# Patient Record
Sex: Female | Born: 1950 | Race: White | Hispanic: No | Marital: Married | Smoking: Never smoker
Health system: Southern US, Community
[De-identification: ages and names within clinical notes are randomized; demographics above are authoritative.]

## PROBLEM LIST (undated history)

## (undated) DIAGNOSIS — C4370 Malignant melanoma of unspecified lower limb, including hip: Secondary | ICD-10-CM

## (undated) DIAGNOSIS — C4490 Unspecified malignant neoplasm of skin, unspecified: Secondary | ICD-10-CM

## (undated) DIAGNOSIS — K5792 Diverticulitis of intestine, part unspecified, without perforation or abscess without bleeding: Secondary | ICD-10-CM

## (undated) DIAGNOSIS — I8390 Asymptomatic varicose veins of unspecified lower extremity: Secondary | ICD-10-CM

## (undated) DIAGNOSIS — K5730 Diverticulosis of large intestine without perforation or abscess without bleeding: Secondary | ICD-10-CM

## (undated) DIAGNOSIS — F1910 Other psychoactive substance abuse, uncomplicated: Secondary | ICD-10-CM

## (undated) DIAGNOSIS — I10 Essential (primary) hypertension: Secondary | ICD-10-CM

## (undated) HISTORY — PX: FOOT SURGERY: SHX648

## (undated) HISTORY — DX: Asymptomatic varicose veins of unspecified lower extremity: I83.90

## (undated) HISTORY — PX: COLONOSCOPY: SHX174

## (undated) HISTORY — DX: Unspecified malignant neoplasm of skin, unspecified: C44.90

## (undated) HISTORY — PX: FLEXIBLE SIGMOIDOSCOPY: SHX1649

## (undated) HISTORY — DX: Other psychoactive substance abuse, uncomplicated: F19.10

## (undated) HISTORY — PX: CATARACT EXTRACTION, BILATERAL: SHX1313

## (undated) HISTORY — DX: Diverticulitis of intestine, part unspecified, without perforation or abscess without bleeding: K57.92

## (undated) HISTORY — DX: Diverticulosis of large intestine without perforation or abscess without bleeding: K57.30

## (undated) HISTORY — DX: Malignant melanoma of unspecified lower limb, including hip: C43.70

---

## 2004-03-14 ENCOUNTER — Ambulatory Visit: Payer: Self-pay | Admitting: Internal Medicine

## 2004-10-20 ENCOUNTER — Emergency Department (HOSPITAL_COMMUNITY): Admission: EM | Admit: 2004-10-20 | Discharge: 2004-10-21 | Payer: Self-pay | Admitting: Emergency Medicine

## 2004-10-23 ENCOUNTER — Ambulatory Visit: Payer: Self-pay | Admitting: Internal Medicine

## 2005-07-05 ENCOUNTER — Emergency Department (HOSPITAL_COMMUNITY): Admission: EM | Admit: 2005-07-05 | Discharge: 2005-07-05 | Payer: Self-pay | Admitting: Family Medicine

## 2006-01-20 ENCOUNTER — Ambulatory Visit: Payer: Self-pay | Admitting: Internal Medicine

## 2006-06-23 ENCOUNTER — Emergency Department (HOSPITAL_COMMUNITY): Admission: EM | Admit: 2006-06-23 | Discharge: 2006-06-23 | Payer: Self-pay | Admitting: Family Medicine

## 2007-08-25 ENCOUNTER — Encounter: Payer: Self-pay | Admitting: *Deleted

## 2007-08-25 DIAGNOSIS — Z9189 Other specified personal risk factors, not elsewhere classified: Secondary | ICD-10-CM | POA: Insufficient documentation

## 2007-08-25 DIAGNOSIS — I10 Essential (primary) hypertension: Secondary | ICD-10-CM | POA: Insufficient documentation

## 2007-08-25 DIAGNOSIS — Z85820 Personal history of malignant melanoma of skin: Secondary | ICD-10-CM

## 2007-11-02 ENCOUNTER — Encounter: Payer: Self-pay | Admitting: Internal Medicine

## 2007-11-02 ENCOUNTER — Other Ambulatory Visit: Admission: RE | Admit: 2007-11-02 | Discharge: 2007-11-02 | Payer: Self-pay | Admitting: Obstetrics and Gynecology

## 2007-11-10 ENCOUNTER — Ambulatory Visit: Payer: Self-pay | Admitting: Gastroenterology

## 2007-11-24 ENCOUNTER — Ambulatory Visit: Payer: Self-pay | Admitting: Gastroenterology

## 2008-01-17 ENCOUNTER — Telehealth: Payer: Self-pay | Admitting: Internal Medicine

## 2008-02-17 ENCOUNTER — Ambulatory Visit: Payer: Self-pay | Admitting: Internal Medicine

## 2008-02-17 DIAGNOSIS — E559 Vitamin D deficiency, unspecified: Secondary | ICD-10-CM | POA: Insufficient documentation

## 2008-02-17 LAB — CONVERTED CEMR LAB
BUN: 20 mg/dL (ref 6–23)
Basophils Absolute: 0 10*3/uL (ref 0.0–0.1)
Basophils Relative: 1.1 % (ref 0.0–3.0)
Calcium: 9.6 mg/dL (ref 8.4–10.5)
Chloride: 104 meq/L (ref 96–112)
Cholesterol: 193 mg/dL (ref 0–200)
Creatinine, Ser: 0.8 mg/dL (ref 0.4–1.2)
Eosinophils Absolute: 0.1 10*3/uL (ref 0.0–0.7)
GFR calc Af Amer: 95 mL/min
GFR calc non Af Amer: 79 mL/min
HCT: 39 % (ref 36.0–46.0)
HDL: 41.7 mg/dL (ref >39.0)
MCHC: 33.5 g/dL (ref 30.0–36.0)
MCV: 90.3 fL (ref 78.0–100.0)
Monocytes Absolute: 0.4 10*3/uL (ref 0.1–1.0)
Neutro Abs: 2.1 10*3/uL (ref 1.4–7.7)
Neutrophils Relative %: 52.1 % (ref 43.0–77.0)
RBC: 4.32 M/uL (ref 3.87–5.11)
TSH: 1.8 microintl units/mL (ref 0.35–5.50)
VLDL: 14 mg/dL (ref 0–40)

## 2008-02-20 ENCOUNTER — Encounter: Payer: Self-pay | Admitting: Internal Medicine

## 2008-05-15 ENCOUNTER — Encounter: Payer: Self-pay | Admitting: Internal Medicine

## 2008-05-22 ENCOUNTER — Telehealth: Payer: Self-pay | Admitting: Internal Medicine

## 2009-02-14 ENCOUNTER — Telehealth (INDEPENDENT_AMBULATORY_CARE_PROVIDER_SITE_OTHER): Payer: Self-pay | Admitting: *Deleted

## 2009-07-18 ENCOUNTER — Ambulatory Visit: Payer: Self-pay | Admitting: Internal Medicine

## 2009-07-18 DIAGNOSIS — N3090 Cystitis, unspecified without hematuria: Secondary | ICD-10-CM

## 2009-07-18 LAB — CONVERTED CEMR LAB
Bilirubin Urine: NEGATIVE
Ketones, urine, test strip: NEGATIVE
Protein, U semiquant: NEGATIVE
Specific Gravity, Urine: <1.005
Urobilinogen, UA: 0.2
pH: 5

## 2010-01-14 ENCOUNTER — Telehealth: Payer: Self-pay | Admitting: Gastroenterology

## 2010-01-16 ENCOUNTER — Telehealth: Payer: Self-pay | Admitting: Gastroenterology

## 2010-01-16 ENCOUNTER — Ambulatory Visit: Payer: Self-pay | Admitting: Gastroenterology

## 2010-01-16 DIAGNOSIS — R1032 Left lower quadrant pain: Secondary | ICD-10-CM | POA: Insufficient documentation

## 2010-01-25 ENCOUNTER — Ambulatory Visit: Payer: Self-pay | Admitting: Gastroenterology

## 2010-01-25 ENCOUNTER — Ambulatory Visit (HOSPITAL_COMMUNITY): Admission: RE | Admit: 2010-01-25 | Discharge: 2010-01-25 | Payer: Self-pay | Admitting: Gastroenterology

## 2010-02-06 ENCOUNTER — Telehealth: Payer: Self-pay | Admitting: Internal Medicine

## 2010-06-04 NOTE — Assessment & Plan Note (Signed)
Summary: PINK URINE FREQUENCY  STC   Vital Signs:  Patient profile:   60 year old female Height:      63 inches Weight:      188 pounds BMI:     33.42 O2 Sat:      97 % on Room air Temp:     97.4 degrees F oral Pulse rate:   78 / minute BP sitting:   152 / 100  (left arm) Cuff size:   regular  Vitals Entered By: Bill Salinas CMA (July 18, 2009 9:46 AM)  O2 Flow:  Room air CC: pt here with complaint of urinary freq. that just started this am, pt states urine had a pinkish color but denies any pain with urination/pt is due for mammogram/ ab   Primary Care Provider:  Jacques Navy MD  CC:  pt here with complaint of urinary freq. that just started this am and pt states urine had a pinkish color but denies any pain with urination/pt is due for mammogram/ ab.  History of Present Illness: She has frequency, uregency and hematuria. Denies fever, back pain, N/V. She does have minor dysuria.  Current Medications (verified): 1)  Benazepril Hcl 20 Mg Tabs (Benazepril Hcl) .... 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  The patient denies anorexia, fever, weight loss, chest pain, abdominal pain, muscle weakness, and enlarged lymph nodes.    Physical Exam  General:  Well-developed,well-nourished,in no acute distress; alert,appropriate and cooperative throughout examination Head:   normocephalic and atraumatic.   Eyes:  pupils equal, pupils round, corneas and lenses clear, and no injection.   Lungs:  normal respiratory effort and normal breath sounds.   Heart:  normal rate and regular rhythm.     Impression & Recommendations:  Problem # 1:  UTI (ICD-599.0) Positive symptoms and dip U/A  Plan - Septra DS two times a day x 5           azo-pyridine as needed           fluids - cranberry  Her updated medication list for this problem includes:    Sulfamethoxazole-tmp Ds 800-160 Mg Tabs (Sulfamethoxazole-trimethoprim) .Marland Kitchen... 1 by mouth two times a day  Complete Medication List: 1)  Benazepril Hcl 20 Mg Tabs (Benazepril hcl) .... Take 1 tablet by mouth once a day 2)  Sulfamethoxazole-tmp Ds 800-160 Mg Tabs (Sulfamethoxazole-trimethoprim) .Marland Kitchen.. 1 by mouth two times a day Prescriptions: SULFAMETHOXAZOLE-TMP DS 800-160 MG TABS (SULFAMETHOXAZOLE-TRIMETHOPRIM) 1 by mouth two times a day  #10 x 0   Entered and Authorized by:   Jacques Navy MD   Signed by:   Jacques Navy MD on 07/18/2009   Method used:   Electronically to        [facility] [address] [phone]* (retail)       [address]  [place], [place]  [address]       Ph: [phone] or [phone]       Fax: [phone]   RxID:   [id number]     Laboratory Results   Urine Tests   Date/Time Reported: 07/18/2009 9:55am  Routine Urinalysis   Color: lt. yellow Appearance: Clear Glucose: negative   (Normal Range: Negative) Bilirubin: negative   (Normal Range: Negative) Ketone: negative   (Normal Range: Negative) Spec. Gravity: <1.005   (Normal Range: 1.003-1.035) Blood: large   (Normal Range: Negative) pH: 5.0   (Normal Range: 5.0-8.0) Protein: negative   (Normal Range: Negative) Urobilinogen: 0.2   (Normal Range: 0-1) Nitrite: negative   (Normal Range: Negative) Leukocyte Esterace: large   (Normal Range: Negative)

## 2010-06-04 NOTE — Procedures (Signed)
Summary: Flexible Sigmoidoscopy  Patient: Mallory Hines Note: All result statuses are Final unless otherwise noted.  Tests: (1) Flexible Sigmoidoscopy (FLX)  FLX Flexible Sigmoidoscopy                             DONE     [facility]     [address] [place], [place]  [address]           FLEXIBLE SIGMOIDOSCOPY PROCEDURE REPORT           PATIENT:  Mallory Hines  MR#:  [id number]     BIRTHDATE:  1950/07/18, 59 yrs. old  GENDER:  female           ENDOSCOPIST:  Barbette Hair. Arlyce Dice, MD     Referred by:           PROCEDURE DATE:  01/25/2010     PROCEDURE:  Flexible Sigmoidoscopy, diagnostic     ASA CLASS:  Class I     INDICATIONS:  rectal bleeding           MEDICATIONS:   Fentanyl 65 mcg IV, Versed 7 mg IV           DESCRIPTION OF PROCEDURE:   After the risks benefits and     alternatives of the procedure were thoroughly explained, informed     consent was obtained.  Digital rectal exam was performed and     revealed moderate external hemorrhoids.   The Pentax Colonoscope     [id number] endoscope was introduced through the anus and advanced to     the descending colon (60cm), without limitations.  The quality of     the prep was .  The instrument was then slowly withdrawn as the     mucosa was fully examined.     <<PROCEDUREIMAGES>>           Scattered diverticula were found in the sigmoid colon (see     image1).  The examination was otherwise normal (see image2).     Retroflexed views in the rectum revealed no abnormalities.    The     scope was then withdrawn from the patient and the procedure     terminated.           COMPLICATIONS:  None           ENDOSCOPIC IMPRESSION:     1) Diverticula, scattered in the sigmoid colon     2) Otherwise normal examination.           Limited rectal bleeding secondary to hemorrhoids           RECOMMENDATIONS:     1) follow-up: office PRN           REPEAT EXAM:  No           ______________________________     Barbette Hair. Arlyce Dice,  MD           CC:  Jacques Navy, MD           n.     Rosalie DoctorBarbette Hair. Kaplan at 01/25/2010 09:05 AM           Valinda Party, [phone]  Note: An exclamation mark (!) indicates a result that was not dispersed into the flowsheet. Document Creation Date: 01/25/2010 9:06 AM _______________________________________________________________________  (1) Order result status: Final Collection or observation date-time: 01/25/2010 09:02 Requested date-time:  Receipt date-time:  Reported  date-time:  Referring Physician:   Ordering Physician: Melvia Heaps [phone]) Specimen Source:  Source: Launa Grill Order Number: [id number] Lab site:

## 2010-06-04 NOTE — Letter (Signed)
Summary: [facility]  [address] [place], [place] [address]   Phone: [phone]  Fax: [phone]       SEDONIA KITNER    16-Jun-1950    MRN: [id number]        Procedure Day /Date:FRIDAY 01/25/2010     Arrival Time:7:30AM     Procedure Time:8:30AM     Location of Procedure:                     X   [facility] ( Outpatient Registration)     PREPARATION FOR FLEXIBLE SIGMOIDOSCOPY WITH MAGNESIUM CITRATE  Prior to the day before your procedure, purchase one 8 oz. bottle of Magnesium Citrate and one Fleet Enema from the laxative section of your drugstore.  _________________________________________________________________________________________________  THE DAY BEFORE YOUR PROCEDURE             DATE:01/24/2010  DAY: THURSDAY  1.   Have a clear liquid dinner the night before your procedure.  2.   Do not drink anything colored red or purple.  Avoid juices with pulp.  No orange juice.              CLEAR LIQUIDS INCLUDE: Water Jello Ice Popsicles Tea (sugar ok, no milk/cream) Powdered fruit flavored drinks Coffee (sugar ok, no milk/cream) Gatorade Juice: apple, white grape, white cranberry  Lemonade Clear bullion, consomm, broth Carbonated beverages (any kind) Strained chicken noodle soup Hard Candy   3.   At 7:00 pm the night before your procedure, drink one bottle of Magnesium Citrate over ice.  4.   Drink at least 3 more glasses of clear liquids before bedtime (preferably juices).  5.   Results are expected usually within 1 to 6 hours after taking the Magnesium Citrate.  ___________________________________________________________________________________________________  THE DAY OF YOUR PROCEDURE            DATE: 01/25/2010   DAY: FRIDAY 1.   Use Fleet Enema one hour prior to coming for procedure 2.   You may drink clear liquids until 4:30AM  (4  hours before exam)      MEDICATION INSTRUCTIONS  Unless otherwise  instructed, you should take regular prescription medications with a small sip of water as early as possible the morning of your procedure.         OTHER INSTRUCTIONS  You will need a responsible adult at least 59 years of age to accompany you and drive you home.   This person must remain in the waiting room during your procedure.  Wear loose fitting clothing that is easily removed.  Leave jewelry and other valuables at home.  However, you may wish to bring a book to read or an iPod/MP3 player to listen to music as you wait for your procedure to start.  Remove all body piercing jewelry and leave at home.  Total time from sign-in until discharge is approximately 2-3 hours.  You should go home directly after your procedure and rest.  You can resume normal activities the day after your procedure.  The day of your procedure you should not:   Drive   Make legal decisions   Operate machinery   Drink alcohol   Return to work  You will receive specific instructions about eating, activities and medications before you leave.   The above instructions have been reviewed and explained to me by   _______________________    I fully understand and can verbalize these instructions _____________________________ Date _________

## 2010-06-04 NOTE — Progress Notes (Signed)
Summary: Triage-Rectal Bleeding  ---- Converted from flag ---- ---- 01/14/2010 10:41 AM, Harlow Mares CMA (AAMA) wrote: per paging system patient called at 6:45am states that she is passing blood. ------------------------------  Phone Note Outgoing Call Call back at [facility] Phone [phone]   Call placed by: Laureen Ochs LPN,  January 14, 2010 11:13 AM Call placed to: Patient Summary of Call: Message left for patient to callback.  Initial call taken by: Laureen Ochs LPN,  January 14, 2010 11:15 AM  Follow-up for Phone Call        Message left for patient to callback. Laureen Ochs LPN  January 14, 2010 12:34 PM  Message left for patient to callback. Laureen Ochs LPN  January 14, 2010 2:29 PM   Additional Follow-up for Phone Call Additional follow up Details #1::        Pt. states she had abd. pain and some rectal bleeding  this weekend. She spoke with Dr.Brodie on call and was started on Cipro/Flagyl.  Pt. is feeling better now, but needs to be seen for follow-up.  1) See Dr.Kaplan 01-16-10 at 11:15am 2) Take antibiotics as directed 3) Soft,bland diet. No spicy,greasy,fried foods. 4) tylenol/Ibuprofen as needed 5) Heating pad to abdomen as needed. 6) If symptoms become worse call back immediately or go to ER.  Additional Follow-up by: Laureen Ochs LPN,  January 14, 2010 2:45 PM

## 2010-06-04 NOTE — Procedures (Signed)
Summary: Instructions For Procedure / [facility]  Instructions For Procedure / [facility]   Imported By: Lennie Odor 01/18/2010 14:29:16  _____________________________________________________________________  External Attachment:    Type:   Image     Comment:   External Document

## 2010-06-04 NOTE — Progress Notes (Signed)
Summary: flu vaccine    Immunization History:  Influenza Immunization History:    Influenza:  .05ml left deltoid im novartis lot# [id number] (01/31/2010)

## 2010-06-04 NOTE — Progress Notes (Signed)
Summary: Triage-diarrhea  Phone Note Call from Patient Call back at Home Phone [phone]   Caller: Patient Call For: Dr. Arlyce Dice Reason for Call: Talk to Nurse Summary of Call: having diarrhea  Initial call taken by: Vallarie Mare,  January 16, 2010 2:25 PM  Follow-up for Phone Call        Message left for patient to callback. Laureen Ochs LPN  January 16, 2010 2:55 PM  Message left for patient to callback. Laureen Ochs LPN  January 17, 2010 8:10 AM   Additional Follow-up for Phone Call Additional follow up Details #1::        Pt. states she is having watery stools after she eats. Denies pain, blood, fever.   1) Begin a Probiotic daily for 10 days 2) Immodium as needed for diarrhea. 3) BRAT diet, advance as tolerated 4) Keep Sigmoidoscopy appt. 5) If symptoms become worse call back immediately. Additional Follow-up by: Laureen Ochs LPN,  January 17, 2010 11:10 AM    Additional Follow-up for Phone Call Additional follow up Details #2::    ok Follow-up by: Louis Meckel MD,  January 17, 2010 2:40 PM

## 2010-06-04 NOTE — Assessment & Plan Note (Signed)
Summary: F/U ON DIVERTICULITIS FLARE                  Mallory Hines   History of Present Illness Visit Type: Initial Visit Primary GI MD: Melvia Heaps MD [facility] Primary Aiko Belko: Jacques Navy MD Chief Complaint: Patient developed some abdominal pain and bloating for the last week. She has been eating alot of nuts and she passed alot of blood Sunday morning. She called Dr. Juanda Chance on call and she gave cipro and flagyl. History of Present Illness:   Mallory Hines is a pleasant 60 year old white female referred at the request of Dr. Debby Bud for evaluation of abdominal pain.  Approximally 4 days ago she developed moderately severe left lower quadrant pain that tended to occur with walking.  When lying still she was pain-free.  This was accompanied by moderate constipation.  When she did have a bowel movement she passed a small amount of dark blood.  She has diverticulosis determined by colonoscopy in 2009.  Within hours of starting Cipro and Flagyl her abdominal pain entirely resolved.  The following day she had a normal bowel movement.  She has had no recurrent bleeding.   GI Review of Systems    Reports abdominal pain and  bloating.      Denies acid reflux, belching, chest pain, dysphagia with liquids, dysphagia with solids, heartburn, loss of appetite, nausea, vomiting, vomiting blood, weight loss, and  weight gain.      Reports change in bowel habits, constipation, diarrhea, and  rectal bleeding.     Denies anal fissure, black tarry stools, diverticulosis, fecal incontinence, heme positive stool, hemorrhoids, irritable bowel syndrome, jaundice, light color stool, liver problems, and  rectal pain.    Current Medications (verified): 1)  Lotensin 20 Mg Tabs (Benazepril Hcl) .... Take One By Mouth Once Daily 2)  Metronidazole 250 Mg Tabs (Metronidazole) .... Take One By Mouth Three Times A Day 3)  Ciprofloxacin Hcl 500 Mg Tabs (Ciprofloxacin Hcl) .... Take One By Mouth Two Times A Day  Allergies  (verified): 1)  Codeine 2)  Nsaids 3)  Celebrex 4)  * Mobic  Past History:  Past Medical History: Reviewed history from 01/15/2010 and no changes required. MELANOMA, LEG, HX OF (ICD-V10.82) CHICKENPOX, HX OF AS A CHILD (ICD-V15.9) MEASLES, HX OF AS A CHILD (ICD-V12.09) MORTON'S NEUROMA, HX OF (ICD-V15.9) HYPERTENSION (ICD-401.9) Diverticulosis 2009  Past Surgical History: Reviewed history from 02/17/2008 and no changes required. WISDOM TEETH EXTRACTION, HX OF (ICD-V15.9) CAESAREAN SECTION, HX OF (ICD-V39.01) * Hx of SURGICAL EXCISION OF A MELANOMA FROM RIGHT LOWER EXTREMITY  G2P2 1 by C-section     Family History: Reviewed history from 02/17/2008 and no changes required. Mother - deceased @ 60 - pancreatic cancer, OA, Father - deceased @ 30: CAD/MI -fatal Neg- colon cancer Matuernal Aunt - breast cancer Sister - IDDM  Social History: Reviewed history from 02/17/2008 and no changes required. 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  Review of Systems  The patient denies allergy/sinus, anemia, anxiety-new, arthritis/joint pain, back pain, blood in urine, breast changes/lumps, change in vision, confusion, cough, coughing up blood, depression-new, fainting, fatigue, fever, headaches-new, hearing problems, heart murmur, heart rhythm changes, itching, menstrual pain, muscle pains/cramps, night sweats, nosebleeds, pregnancy symptoms, shortness of breath, skin rash, sleeping problems, sore throat, swelling of feet/legs, swollen lymph glands, thirst - excessive , urination - excessive , urination changes/pain,  urine leakage, vision changes, and voice change.    Vital Signs:  Patient profile:   60 year old female Height:      63 inches Weight:      183.6 pounds BMI:     32.64 Pulse rate:   80 / minute Pulse rhythm:   regular BP sitting:   142 / 94  (left arm) Cuff size:    regular  Vitals Entered By: Harlow Mares CMA Duncan Dull) (January 16, 2010 11:09 AM)  Physical Exam  Additional Exam:  On physical exam she is a well-developed well-nourished female  skin: anicteric HEENT: normocephalic; PEERLA; no nasal or pharyngeal abnormalities neck: supple nodes: no cervical lymphadenopathy chest: clear to ausculatation and percussion heart: no murmurs, gallops, or rubs abd: soft, nontender; BS normoactive; no abdominal masses, tenderness, organomegaly rectal: deferred ext: no cynanosis, clubbing, edema skeletal: no deformities neuro: oriented x 3; no focal abnormalities    Impression & Recommendations:  Problem # 1:  ABDOMINAL PAIN, LEFT LOWER QUADRANT (ICD-789.04)  Symptoms could have been due to mild diverticulitis.  Limited rectal bleeding may be seen with this;  alternatively;  she may have had  constipation with limited hemorrhoidal bleeding.  Recommendations #1 complete a seven-day course of antibiotics #2 flexible sigmoidoscopy in approximately one week  Orders: ZFLEX (ZFL)  Patient Instructions: 1)  Copy sent to : Jacques Navy MD 2)  Your Flex Sig is scheduled for 01/25/2010 at 8:30am at [facility] 3)  The medication list was reviewed and reconciled.  All changed / newly prescribed medications were explained.  A complete medication list was provided to the patient / caregiver.

## 2010-06-11 ENCOUNTER — Encounter: Payer: Self-pay | Admitting: Internal Medicine

## 2010-06-26 NOTE — Letter (Signed)
Summary: [facility]   Imported By: Sherian Rein 06/18/2010 12:48:29  _____________________________________________________________________  External Attachment:    Type:   Image     Comment:   External Document

## 2010-10-08 ENCOUNTER — Other Ambulatory Visit: Payer: Self-pay | Admitting: Internal Medicine

## 2011-04-25 ENCOUNTER — Telehealth: Payer: Self-pay | Admitting: Gastroenterology

## 2011-04-25 DIAGNOSIS — K5792 Diverticulitis of intestine, part unspecified, without perforation or abscess without bleeding: Secondary | ICD-10-CM

## 2011-04-25 MED ORDER — CIPROFLOXACIN HCL 500 MG PO TABS: 500.0000 mg | ORAL_TABLET | Freq: Two times a day (BID) | ORAL | Status: AC

## 2011-04-25 MED ORDER — METRONIDAZOLE 500 MG PO TABS: 500.0000 mg | ORAL_TABLET | Freq: Two times a day (BID) | ORAL | Status: AC

## 2011-04-25 NOTE — Telephone Encounter (Signed)
Pt scheduled for CT scan 05/02/11 pt to arrive at [facility] CT @8 :45am. Pt to drink 1 bottle of contrast at 7am, the 2nd bottle of contrast at 8am. Pt to be NPO 4 hours prior to scan. Pt to pick up contrast 05/01/11.

## 2011-04-25 NOTE — Telephone Encounter (Signed)
Addended by: Chrystie Nose R on: 04/25/2011 02:05 PM   Modules accepted: Orders

## 2011-04-25 NOTE — Telephone Encounter (Signed)
Mallory Hines pt calling stating that she thinks she is having a diverticulitis flare. Pt states she is having pain in her lower left quadrant below her belly button. Pt states it hurts when she walks and when she sits, she is very tender and she is bloated. Last flare was in Sept of 2011, at that time she was given Flagyl and Cipro. Dr. Jarold Motto as doc of the day please advise.

## 2011-04-25 NOTE — Telephone Encounter (Signed)
This lady needs office followup with PA next week. However come in for CBC, sed rate, and she needs CT scan of the abdomen and pelvis. Go ahead and start Cipro 500 mg and metronidazole 500 mg twice a day for 7 days with office visit next week after her CT and labs. Previous sigmoidoscopy by Dr. Arlyce Dice showed minimal diverticulosis

## 2011-04-25 NOTE — Telephone Encounter (Signed)
Pt aware Rx's sent to the pharmacy

## 2011-04-28 ENCOUNTER — Other Ambulatory Visit: Payer: Self-pay

## 2011-05-01 ENCOUNTER — Encounter: Payer: Self-pay | Admitting: Gastroenterology

## 2011-05-01 ENCOUNTER — Telehealth: Payer: Self-pay | Admitting: Gastroenterology

## 2011-05-01 NOTE — Telephone Encounter (Signed)
Error

## 2011-05-01 NOTE — Telephone Encounter (Signed)
Pt walked into ofc before I could call her back. Pt wanted the contrast for her CT in am and her appt time. Went over instructions for CT in am at [facility] location; arrive at 08:45am; drink 1 bottle contrast at 7am, the other at 0800am,; NPO 4 hours prior. There are no appt with mid levels in for Monday, but I explained to pt Dr Arlyce Dice is out on leave and when appts are available with a mid level, I will call her. Pt stated understanding.

## 2011-05-02 ENCOUNTER — Ambulatory Visit (INDEPENDENT_AMBULATORY_CARE_PROVIDER_SITE_OTHER)
Admission: RE | Admit: 2011-05-02 | Discharge: 2011-05-02 | Disposition: A | Discharge status: Home / Self Care | Payer: PRIVATE HEALTH INSURANCE | Source: Ambulatory Visit | Attending: Gastroenterology | Admitting: Gastroenterology

## 2011-05-02 DIAGNOSIS — K5732 Diverticulitis of large intestine without perforation or abscess without bleeding: Secondary | ICD-10-CM

## 2011-05-02 DIAGNOSIS — K5792 Diverticulitis of intestine, part unspecified, without perforation or abscess without bleeding: Secondary | ICD-10-CM

## 2011-05-02 MED ORDER — IOHEXOL 300 MG/ML  SOLN
80.0000 mL | Freq: Once | INTRAMUSCULAR | Status: AC | PRN
  Administered 2011-05-02: 80 mL via INTRAVENOUS

## 2011-05-05 ENCOUNTER — Telehealth: Payer: Self-pay | Admitting: Gastroenterology

## 2011-05-05 NOTE — Telephone Encounter (Signed)
Discussed results of pt's CT scan with pt. She reports she is better and reports she fell down the stairs about 2 weeks ago. She did do a somersault and maybe this agravated her problems. I did explain she does have some changes in the lumbar region. Per her request, mailed her a copy of the scan. Pt will call back for further problems.

## 2011-05-21 ENCOUNTER — Other Ambulatory Visit: Payer: Self-pay | Admitting: Internal Medicine

## 2011-05-21 NOTE — Telephone Encounter (Signed)
Refill request for benazepril 20 mg with 6 refills. Last refilled on 6.5.12. Pt has not been seen within a year. OK to refill?

## 2011-05-28 ENCOUNTER — Ambulatory Visit: Payer: PRIVATE HEALTH INSURANCE

## 2011-05-29 ENCOUNTER — Ambulatory Visit (INDEPENDENT_AMBULATORY_CARE_PROVIDER_SITE_OTHER): Payer: PRIVATE HEALTH INSURANCE

## 2011-05-29 DIAGNOSIS — Z23 Encounter for immunization: Secondary | ICD-10-CM

## 2011-08-15 ENCOUNTER — Telehealth: Payer: Self-pay | Admitting: Gastroenterology

## 2011-08-15 NOTE — Telephone Encounter (Signed)
Pt states she has been doing a lot of lifting moving her lawn furniture. She has noticed some bright red blood from her rectum. Pt wants to know if this could have aggravated her hemorrhoids. Let pt know that is possible. Pt instructed to try some OTC hemorrhoid medication and to call us if no improvement. Pt verbalized understanding.

## 2012-06-07 ENCOUNTER — Other Ambulatory Visit: Payer: Self-pay | Admitting: Internal Medicine

## 2012-08-31 ENCOUNTER — Other Ambulatory Visit: Payer: Self-pay | Admitting: Internal Medicine

## 2012-09-09 ENCOUNTER — Other Ambulatory Visit: Payer: Self-pay | Admitting: Internal Medicine

## 2012-09-21 ENCOUNTER — Encounter: Payer: Self-pay | Admitting: Internal Medicine

## 2012-09-21 ENCOUNTER — Ambulatory Visit (INDEPENDENT_AMBULATORY_CARE_PROVIDER_SITE_OTHER): Payer: 59 | Admitting: Internal Medicine

## 2012-09-21 VITALS — BP 170/98 | HR 76 | Temp 97.9°F | Resp 16 | Wt 182.0 lb

## 2012-09-21 DIAGNOSIS — I10 Essential (primary) hypertension: Secondary | ICD-10-CM

## 2012-09-21 DIAGNOSIS — E559 Vitamin D deficiency, unspecified: Secondary | ICD-10-CM

## 2012-09-21 MED ORDER — BENAZEPRIL HCL 20 MG PO TABS: 20.0000 mg | ORAL_TABLET | Freq: Every day | ORAL | Status: DC

## 2012-09-21 MED ORDER — VITAMIN D 1000 UNITS PO TABS: 1000.0000 [IU] | ORAL_TABLET | Freq: Every day | ORAL | Status: DC

## 2012-09-21 NOTE — Assessment & Plan Note (Signed)
Re-start Vit D 

## 2012-09-21 NOTE — Assessment & Plan Note (Signed)
Mallory Hines has run out of her meds a few days ago Re-start Rx Well w/labs w/Dr Norins in 6 mo

## 2012-09-21 NOTE — Progress Notes (Signed)
  Subjective:    HPI  C/o elev BP - ran out of meds a few days ago  Wt Readings from Last 3 Encounters:  09/21/12 182 lb (82.555 kg)  01/16/10 183 lb 9.6 oz (83.28 kg)  07/18/09 188 lb (85.276 kg)   BP Readings from Last 3 Encounters:  09/21/12 170/98  01/16/10 142/94  07/18/09 152/100       Review of Systems  Constitutional: Negative.  Negative for fever, chills, diaphoresis, activity change, appetite change, fatigue and unexpected weight change.  HENT: Negative for hearing loss, ear pain, nosebleeds, congestion, sore throat, facial swelling, rhinorrhea, sneezing, mouth sores, trouble swallowing, neck pain, neck stiffness, postnasal drip, sinus pressure and tinnitus.   Eyes: Negative for pain, discharge, redness, itching and visual disturbance.  Respiratory: Negative for cough, chest tightness, shortness of breath, wheezing and stridor.   Cardiovascular: Negative for chest pain, palpitations and leg swelling.  Gastrointestinal: Negative for nausea, diarrhea, constipation, blood in stool, abdominal distention, anal bleeding and rectal pain.  Genitourinary: Negative for dysuria, urgency, frequency, hematuria, flank pain, vaginal bleeding, vaginal discharge, difficulty urinating, genital sores and pelvic pain.  Musculoskeletal: Negative for back pain, joint swelling, arthralgias and gait problem.  Skin: Negative.  Negative for rash.  Neurological: Negative for dizziness, tremors, seizures, syncope, speech difficulty, weakness, numbness and headaches.  Hematological: Negative for adenopathy. Does not bruise/bleed easily.  Psychiatric/Behavioral: Negative for suicidal ideas, behavioral problems, sleep disturbance, dysphoric mood and decreased concentration. The patient is not nervous/anxious.        Objective:   Physical Exam  Constitutional: She appears well-developed. No distress.  HENT:  Head: Normocephalic.  Right Ear: External ear normal.  Left Ear: External ear normal.   Nose: Nose normal.  Mouth/Throat: Oropharynx is clear and moist.  Eyes: Conjunctivae are normal. Pupils are equal, round, and reactive to light. Right eye exhibits no discharge. Left eye exhibits no discharge.  Neck: Normal range of motion. Neck supple. No JVD present. No tracheal deviation present. No thyromegaly present.  Cardiovascular: Normal rate, regular rhythm and normal heart sounds.   Pulmonary/Chest: No stridor. No respiratory distress. She has no wheezes.  Abdominal: Soft. Bowel sounds are normal. She exhibits no distension and no mass. There is no tenderness. There is no rebound and no guarding.  Musculoskeletal: She exhibits no edema and no tenderness.  Lymphadenopathy:    She has no cervical adenopathy.  Neurological: She displays normal reflexes. No cranial nerve deficit. She exhibits normal muscle tone. Coordination normal.  Skin: No rash noted. No erythema.  Psychiatric: She has a normal mood and affect. Her behavior is normal. Judgment and thought content normal.    Lab Results  Component Value Date   WBC 3.9* 02/17/2008   HGB 13.1 02/17/2008   HCT 39.0 02/17/2008   PLT 268 02/17/2008   GLUCOSE 111* 02/17/2008   CHOL 193 02/17/2008   TRIG 69 02/17/2008   HDL 41.7 02/17/2008   LDLCALC 138* 02/17/2008   NA 140 02/17/2008   K 4.6 02/17/2008   CL 104 02/17/2008   CREATININE 0.8 02/17/2008   BUN 20 02/17/2008   CO2 31 02/17/2008   TSH 1.80 02/17/2008          Assessment & Plan:

## 2013-05-12 ENCOUNTER — Telehealth: Payer: Self-pay | Admitting: Internal Medicine

## 2013-05-12 NOTE — Telephone Encounter (Signed)
I called patient back. She was inquiring if she has had the pneumonia vaccine. I advised her of the immunizations she's had but pneumonia is not one of them. She states she will be flying out to [facility] for a few days and would like to know if she should get it before she leaves or what upon her return. Please advise. Thanks.

## 2013-05-12 NOTE — Telephone Encounter (Signed)
Pt called request phone call from the assistant concern about the pneumonia shot that she got?. Please call pt

## 2013-05-12 NOTE — Telephone Encounter (Signed)
Ok to wait. Prevnar first, pneumovax 6 weeks later.

## 2013-05-13 NOTE — Telephone Encounter (Signed)
Left message for patient that she can wait to get the Prevnar vaccine when she returns.

## 2013-07-11 ENCOUNTER — Encounter: Payer: Self-pay | Admitting: *Deleted

## 2013-07-11 ENCOUNTER — Telehealth: Payer: Self-pay | Admitting: Gastroenterology

## 2013-07-11 NOTE — Telephone Encounter (Signed)
She really needs to be seen in the office with either an M.D. or an extender ASAP

## 2013-07-11 NOTE — Telephone Encounter (Signed)
Pt scheduled to see Alonza Bogus PA tomorrow at 3pm. Pt aware of appt date and time.

## 2013-07-11 NOTE — Telephone Encounter (Signed)
Pt ate some granola over the weekend and now she is having lots of bloating, gas, and thinks she is having a diverticulitis flare. Last episode pt had like this she was given Cipro and Flagyl, wants to know is she should be treated with this again. Please advise.

## 2013-07-12 ENCOUNTER — Encounter: Payer: Self-pay | Admitting: Gastroenterology

## 2013-07-12 ENCOUNTER — Ambulatory Visit (INDEPENDENT_AMBULATORY_CARE_PROVIDER_SITE_OTHER): Payer: 59 | Admitting: Gastroenterology

## 2013-07-12 VITALS — BP 130/78 | HR 92 | Ht 63.0 in | Wt 175.8 lb

## 2013-07-12 DIAGNOSIS — K5732 Diverticulitis of large intestine without perforation or abscess without bleeding: Secondary | ICD-10-CM | POA: Insufficient documentation

## 2013-07-12 DIAGNOSIS — R1032 Left lower quadrant pain: Secondary | ICD-10-CM | POA: Insufficient documentation

## 2013-07-12 MED ORDER — METRONIDAZOLE 500 MG PO TABS: 500.0000 mg | ORAL_TABLET | Freq: Two times a day (BID) | ORAL | Status: DC

## 2013-07-12 MED ORDER — CIPROFLOXACIN HCL 500 MG PO TABS: 500.0000 mg | ORAL_TABLET | Freq: Two times a day (BID) | ORAL | Status: DC

## 2013-07-12 NOTE — Patient Instructions (Signed)
We have sent the following medications to your pharmacy for you to pick up at your convenience: Cipro 500 mg, please take one tablet by mouth twice daily for ten days. Flagyl 500 mg, please take one tablet by mouth three times daily for ten days.

## 2013-07-12 NOTE — Progress Notes (Signed)
07/12/2013 Mallory Hines [id number] [birth year]   HISTORY OF PRESENT ILLNESS:  This is a pleasant 63 year old female who is known to Mallory Hines for previous colonoscopies. Her last colonoscopy was in July 2009 at which time she only had diverticulosis and repeat was recommended in 10 years from that time. She then had a flexible sigmoidoscopy in September 2011 at which time she was also only found to have diverticulosis, but the study was otherwise normal. She comes our office today stating that she ate a bunch of granola and nuts on Sunday and shortly after she developed left lower quadrant abdominal pain. She was also having a lot of gas, but that has resolved. She says that she was treated for diverticulitis in November 2014 with Cipro and Flagyl, which resolved her symptoms. Says that she was treated for this by our office, however, I do not see any documentation that we gave her these medications. Regardless, she says that the pain feels the same as it did that time. She denies any nausea, vomiting, fevers, chills, significant change in bowels, rectal bleeding.  She had a CT scan in 2012 for complaints of LLQ abdominal pain, but it was negative for diverticulitis at that time.   Past Medical History  Diagnosis Date  . Diverticulosis of colon (without mention of hemorrhage)   . Skin cancer [id number]    right calf, face   Past Surgical History  Procedure Laterality Date  . Cesarean section  1985  . Foot surgery Right     3 rd toe  . Colonoscopy    . Flexible sigmoidoscopy      reports that she has never smoked. She has never used smokeless tobacco. She reports that she drinks alcohol. She reports that she does not use illicit drugs. family history includes Diabetes in her sister; Heart attack (age of onset: 1) in her father; Pancreatic cancer in her mother. There is no history of Colon cancer. Allergies  Allergen Reactions  . Celecoxib     REACTION: whelps  . Codeine     REACTION: does  not like drug  . Meloxicam     REACTION: whelps  . Nsaids     REACTION: whelps      Outpatient Encounter Prescriptions as of 07/12/2013  Medication Sig  . benazepril (LOTENSIN) 20 MG tablet Take 1 tablet (20 mg total) by mouth daily.  . ciprofloxacin (CIPRO) 500 MG tablet Take 1 tablet (500 mg total) by mouth 2 (two) times daily.  . metroNIDAZOLE (FLAGYL) 500 MG tablet Take 1 tablet (500 mg total) by mouth 2 (two) times daily.  . [DISCONTINUED] cholecalciferol (VITAMIN D) 1000 UNITS tablet Take 1 tablet (1,000 Units total) by mouth daily.     REVIEW OF SYSTEMS  : All other systems reviewed and negative except where noted in the History of Present Illness.   PHYSICAL EXAM: BP 130/78  Pulse 92  Ht 5\' 3"  (1.6 m)  Wt 175 lb 12.8 oz (79.742 kg)  BMI 31.15 kg/m2 General: Well developed white female in no acute distress Head: Normocephalic and atraumatic Eyes:  Sclerae anicteric, conjunctiva pink. Ears: Normal auditory acuity.  Lungs: Clear throughout to auscultation Heart: Regular rate and rhythm Abdomen: Soft, non-distended.  Normal bowel sounds.  Mild-moderate LLQ TTP without R/R/G. Musculoskeletal: Symmetrical with no gross deformities  Skin: No lesions on visible extremities Extremities: No edema  Neurological: Alert oriented x 4, grossly non-focal Psychological:  Alert and cooperative. Normal mood and affect  ASSESSMENT AND PLAN: -LLQ  abdominal pain:  Has diverticulosis.  Could be diverticulitis.  Was treated in November with cipro and flagyl (she says that she was treated by our office, but I do not see any documentation that we treated her) with resolution of her symptoms.  Will repeat antibiotics, cipro 500 mg BID and flagyl 500 mg TID for 10 days.  Patient will call back if symptoms worsen or do not resolve after the 10 day course at which time she may need a CT scan.  She has never had documented evidence of diverticulitis by imaging so may need this as well if she has  future episodes.

## 2013-07-13 NOTE — Progress Notes (Signed)
Reviewed and agree with management. Robert D. Kaplan, M.D., FACG  

## 2013-07-20 ENCOUNTER — Telehealth: Payer: Self-pay | Admitting: Gastroenterology

## 2013-07-20 NOTE — Telephone Encounter (Signed)
Spoke with patient and she has been taking Flagyl TID and bottle said BID. She wants to be sure which is correct. Told patient the TID is correct. She will continue this dose.

## 2013-07-20 NOTE — Telephone Encounter (Signed)
Flagyl was sent for 30 tablets per pharmacy

## 2013-10-18 ENCOUNTER — Other Ambulatory Visit: Payer: Self-pay | Admitting: Internal Medicine

## 2013-11-26 ENCOUNTER — Other Ambulatory Visit: Payer: Self-pay | Admitting: Internal Medicine

## 2014-01-12 ENCOUNTER — Other Ambulatory Visit: Payer: Self-pay | Admitting: Internal Medicine

## 2014-02-02 ENCOUNTER — Telehealth: Payer: Self-pay | Admitting: Gastroenterology

## 2014-02-02 NOTE — Telephone Encounter (Signed)
Patient reports bright red blood after normal bowel movement. She denies any pain, fever or diarrhea. She has not been constipated. She has been on her feet a lot and has done some heavy lifting (pumpkins that are about 50lbs). Offered an appointment tomorrow. Patient declines but does agree to an appointment next week. She will call us back if she acutely worsens.

## 2014-02-03 NOTE — Telephone Encounter (Signed)
ok 

## 2014-02-08 ENCOUNTER — Ambulatory Visit: Payer: 59 | Admitting: Gastroenterology

## 2014-06-07 ENCOUNTER — Other Ambulatory Visit: Payer: 59

## 2014-06-07 ENCOUNTER — Ambulatory Visit (INDEPENDENT_AMBULATORY_CARE_PROVIDER_SITE_OTHER): Payer: 59 | Admitting: Family

## 2014-06-07 ENCOUNTER — Encounter: Payer: Self-pay | Admitting: Family

## 2014-06-07 VITALS — BP 162/88 | HR 85 | Temp 97.8°F | Resp 18 | Ht 63.0 in | Wt 178.8 lb

## 2014-06-07 DIAGNOSIS — N3090 Cystitis, unspecified without hematuria: Secondary | ICD-10-CM

## 2014-06-07 DIAGNOSIS — R350 Frequency of micturition: Secondary | ICD-10-CM

## 2014-06-07 LAB — POCT URINALYSIS DIPSTICK
Bilirubin, UA: NEGATIVE
Blood, UA: POSITIVE
Glucose, UA: NEGATIVE
Ketones, UA: NEGATIVE
Nitrite, UA: NEGATIVE
Protein, UA: NEGATIVE
Spec Grav, UA: 1.02
Urobilinogen, UA: NEGATIVE
pH, UA: 6

## 2014-06-07 MED ORDER — BENAZEPRIL HCL 20 MG PO TABS: ORAL_TABLET | ORAL | Status: DC

## 2014-06-07 MED ORDER — CIPROFLOXACIN HCL 250 MG PO TABS: 250.0000 mg | ORAL_TABLET | Freq: Two times a day (BID) | ORAL | Status: DC

## 2014-06-07 MED ORDER — FLUCONAZOLE 150 MG PO TABS: 150.0000 mg | ORAL_TABLET | Freq: Once | ORAL | Status: DC

## 2014-06-07 NOTE — Assessment & Plan Note (Signed)
In office UA is positive for leukocytes and hematuria. Start Ciprofloxacin. Start diflucan if symptoms of yeast infection develop. Follow up if symptoms worsen or fail to improve.

## 2014-06-07 NOTE — Addendum Note (Signed)
Addended by: Delice Bison E on: 06/07/2014 02:45 PM   Modules accepted: Orders

## 2014-06-07 NOTE — Progress Notes (Signed)
Pre visit review using our clinic review tool, if applicable. No additional management support is needed unless otherwise documented below in the visit note. 

## 2014-06-07 NOTE — Patient Instructions (Signed)
Thank you for choosing [facility].  Summary/Instructions:  Your prescription(s) have been submitted to your pharmacy or been printed and provided for you. Please take as directed and contact our office if you believe you are having problem(s) with the medication(s) or have any questions.  If your symptoms worsen or fail to improve, please contact our office for further instruction, or in case of emergency go directly to the emergency room at the closest medical facility.   Urinary Tract Infection Urinary tract infections (UTIs) can develop anywhere along your urinary tract. Your urinary tract is your body's drainage system for removing wastes and extra water. Your urinary tract includes two kidneys, two ureters, a bladder, and a urethra. Your kidneys are a pair of bean-shaped organs. Each kidney is about the size of your fist. They are located below your ribs, one on each side of your spine. CAUSES Infections are caused by microbes, which are microscopic organisms, including fungi, viruses, and bacteria. These organisms are so small that they can only be seen through a microscope. Bacteria are the microbes that most commonly cause UTIs. SYMPTOMS  Symptoms of UTIs may vary by age and gender of the patient and by the location of the infection. Symptoms in young women typically include a frequent and intense urge to urinate and a painful, burning feeling in the bladder or urethra during urination. Older women and men are more likely to be tired, shaky, and weak and have muscle aches and abdominal pain. A fever may mean the infection is in your kidneys. Other symptoms of a kidney infection include pain in your back or sides below the ribs, nausea, and vomiting. DIAGNOSIS To diagnose a UTI, your caregiver will ask you about your symptoms. Your caregiver also will ask to provide a urine sample. The urine sample will be tested for bacteria and white blood cells. White blood cells are made by your  body to help fight infection. TREATMENT  Typically, UTIs can be treated with medication. Because most UTIs are caused by a bacterial infection, they usually can be treated with the use of antibiotics. The choice of antibiotic and length of treatment depend on your symptoms and the type of bacteria causing your infection. HOME CARE INSTRUCTIONS  If you were prescribed antibiotics, take them exactly as your caregiver instructs you. Finish the medication even if you feel better after you have only taken some of the medication.  Drink enough water and fluids to keep your urine clear or pale yellow.  Avoid caffeine, tea, and carbonated beverages. They tend to irritate your bladder.  Empty your bladder often. Avoid holding urine for long periods of time.  Empty your bladder before and after sexual intercourse.  After a bowel movement, women should cleanse from front to back. Use each tissue only once. SEEK MEDICAL CARE IF:   You have back pain.  You develop a fever.  Your symptoms do not begin to resolve within 3 days. SEEK IMMEDIATE MEDICAL CARE IF:   You have severe back pain or lower abdominal pain.  You develop chills.  You have nausea or vomiting.  You have continued burning or discomfort with urination. MAKE SURE YOU:   Understand these instructions.  Will watch your condition.  Will get help right away if you are not doing well or get worse. Document Released: 01/29/2005 Document Revised: 10/21/2011 Document Reviewed: 05/30/2011 Exit[employer] Patient Information 2015 [employer]. This information is not intended to replace advice given to you by your health care provider.   Make sure you discuss any questions you have with your health care provider.   

## 2014-06-07 NOTE — Progress Notes (Signed)
   Subjective:    Patient ID: Mallory Hines, female    DOB: 09-Feb-1951, 64 y.o.   MRN: [id number]  Chief Complaint  Patient presents with  . Urinary Tract Infection    urinary frequency, a little hematuria, pressure in lower stomach, x3 days     HPI:  Mallory Hines is a 64 y.o. female who presents today for an acute visit.  This is a new problem. Associated symptoms of urinary frequency, slight hematuria and pressure in her lower abdomen for approximately 3 days.    Allergies  Allergen Reactions  . Celecoxib     REACTION: whelps  . Codeine     REACTION: does not like drug  . Meloxicam     REACTION: whelps  . Nsaids     REACTION: whelps    Current Outpatient Prescriptions on File Prior to Visit  Medication Sig Dispense Refill  . benazepril (LOTENSIN) 20 MG tablet TAKE 1 TABLET (20 MG TOTAL) BY MOUTH DAILY. 30 tablet 0   No current facility-administered medications on file prior to visit.    Review of Systems  Constitutional: Negative for fever and chills.  Genitourinary: Positive for urgency, frequency and hematuria. Negative for dysuria and flank pain.  Musculoskeletal: Negative for back pain.      Objective:    BP 162/88 mmHg  Pulse 85  Temp(Src) 97.8 F (36.6 C) (Oral)  Resp 18  Ht 5\' 3"  (1.6 m)  Wt 178 lb 12.8 oz (81.103 kg)  BMI 31.68 kg/m2  SpO2 98% Nursing note and vital signs reviewed.  Physical Exam  Constitutional: She is oriented to person, place, and time. She appears well-developed and well-nourished. No distress.  Cardiovascular: Normal rate, regular rhythm, normal heart sounds and intact distal pulses.   Pulmonary/Chest: Effort normal and breath sounds normal.  Abdominal: There is no tenderness. There is no CVA tenderness.  Neurological: She is alert and oriented to person, place, and time.  Skin: Skin is warm and dry.  Psychiatric: She has a normal mood and affect. Her behavior is normal. Judgment and thought content normal.         Assessment & Plan:

## 2014-06-09 ENCOUNTER — Telehealth: Payer: Self-pay | Admitting: Family

## 2014-06-09 LAB — URINE CULTURE

## 2014-06-09 NOTE — Telephone Encounter (Signed)
Please inform patient she did have a urinary tract infection which was confirmed on her urine culture. The bacteria was susceptible to the ciprofloxacin and it should have taken care of the problem.

## 2014-06-09 NOTE — Telephone Encounter (Signed)
Pt aware of results 

## 2014-07-10 ENCOUNTER — Ambulatory Visit (INDEPENDENT_AMBULATORY_CARE_PROVIDER_SITE_OTHER): Payer: 59 | Admitting: Family

## 2014-07-10 ENCOUNTER — Encounter: Payer: Self-pay | Admitting: Family

## 2014-07-10 VITALS — BP 170/100 | HR 83 | Temp 98.1°F | Resp 18 | Wt 175.4 lb

## 2014-07-10 DIAGNOSIS — J019 Acute sinusitis, unspecified: Secondary | ICD-10-CM | POA: Insufficient documentation

## 2014-07-10 MED ORDER — CEFUROXIME AXETIL 250 MG PO TABS: 250.0000 mg | ORAL_TABLET | Freq: Two times a day (BID) | ORAL | Status: DC

## 2014-07-10 NOTE — Progress Notes (Signed)
   Subjective:    Patient ID: Mallory Hines, female    DOB: 1950-11-05, 64 y.o.   MRN: [id number]  Chief Complaint  Patient presents with  . Cough    Slight sore throat, pressure in ears, sinus pressure, headache and productive cough, x2 days    HPI:  Mallory Hines is a 64 y.o. female who presents today for an acute visit.  This is a new problem. Associated symptoms of sore throat, pressure in her ears, sinus pressure, headache and productive cough has been going on for about 3 days. Has tried OTC coricidin HBP which has helped a little. Has been on ciprofloxacin recently for a UTI.    Allergies  Allergen Reactions  . Celecoxib     REACTION: whelps  . Codeine     REACTION: does not like drug  . Meloxicam     REACTION: whelps  . Nsaids     REACTION: whelps    Current Outpatient Prescriptions on File Prior to Visit  Medication Sig Dispense Refill  . benazepril (LOTENSIN) 20 MG tablet TAKE 1 TABLET (20 MG TOTAL) BY MOUTH DAILY. 90 tablet 3   No current facility-administered medications on file prior to visit.    Review of Systems  Constitutional: Negative for fever and chills.  HENT: Positive for congestion, ear pain, sinus pressure and sore throat.   Respiratory: Positive for cough and chest tightness. Negative for shortness of breath.   Neurological: Positive for headaches.      Objective:    BP 170/100 mmHg  Pulse 83  Temp(Src) 98.1 F (36.7 C) (Oral)  Resp 18  Wt 175 lb 6.4 oz (79.561 kg)  SpO2 96% Nursing note and vital signs reviewed.  Physical Exam  Constitutional: She is oriented to person, place, and time. She appears well-developed and well-nourished. No distress.  HENT:  Right Ear: Hearing, tympanic membrane, external ear and ear canal normal.  Left Ear: Hearing, tympanic membrane, external ear and ear canal normal.  Nose: Right sinus exhibits maxillary sinus tenderness. Right sinus exhibits no frontal sinus tenderness. Left sinus exhibits maxillary  sinus tenderness. Left sinus exhibits no frontal sinus tenderness.  Mouth/Throat: Uvula is midline, oropharynx is clear and moist and mucous membranes are normal.  Cardiovascular: Normal rate, regular rhythm, normal heart sounds and intact distal pulses.   Pulmonary/Chest: Effort normal and breath sounds normal.  Neurological: She is alert and oriented to person, place, and time.  Skin: Skin is warm and dry.  Psychiatric: She has a normal mood and affect. Her behavior is normal. Judgment and thought content normal.       Assessment & Plan:

## 2014-07-10 NOTE — Progress Notes (Signed)
Pre visit review using our clinic review tool, if applicable. No additional management support is needed unless otherwise documented below in the visit note. 

## 2014-07-10 NOTE — Assessment & Plan Note (Signed)
Symptoms and exam consistent with sinusitis. Start Ceftin. Continue Coricidin as needed for symptom relief. Continue other over-the-counter medications as needed for symptom relief. Follow-up if symptoms worsen or fail to improve.

## 2014-07-10 NOTE — Patient Instructions (Addendum)
Thank you for choosing [facility].  Summary/Instructions:  Your prescription(s) have been submitted to your pharmacy or been printed and provided for you. Please take as directed and contact our office if you believe you are having problem(s) with the medication(s) or have any questions.  If your symptoms worsen or fail to improve, please contact our office for further instruction, or in case of emergency go directly to the emergency room at the closest medical facility.   General Recommendations:    Please drink plenty of fluids.  Get plenty of rest   Sleep in humidified air  Use saline nasal sprays  Netti pot   OTC Medications:  Decongestants - helps relieve congestion   Flonase (generic fluticasone) or Nasacort (generic triamcinolone) - please make sure to use the "cross-over" technique at a 45 degree angle towards the opposite eye as opposed to straight up the nasal passageway.   If you have HIGH BLOOD PRESSURE - Coricidin HBP; AVOID any product that is -D as this contains pseudoephedrine which may increase your blood pressure.  Afrin (oxymetazoline) every 6-8 hours for up to 3 days.   Allergies - helps relieve runny nose, itchy eyes and sneezing   Claritin (generic loratidine), Allegra (fexofenidine), or Zyrtec (generic cyrterizine) for runny nose. These medications should not cause drowsiness.  Note - Benadryl (generic diphenhydramine) may be used however may cause drowsiness  Cough -   Delsym or Robitussin (generic dextromethorphan)  Expectorants - helps loosen mucus to ease removal   Mucinex (generic guaifenesin) as directed on the package.  Headaches / General Aches   Tylenol (generic acetaminophen) - DO NOT EXCEED 3 grams (3,000 mg) in a 24 hour time period  Advil/Motrin (generic ibuprofen)   Sore Throat -   Salt water gargle   Chloraseptic (generic benzocaine) spray or lozenges / Sucrets (generic dyclonine)      

## 2014-07-26 DIAGNOSIS — Z0000 Encounter for general adult medical examination without abnormal findings: Secondary | ICD-10-CM | POA: Insufficient documentation

## 2014-07-26 DIAGNOSIS — C44300 Unspecified malignant neoplasm of skin of unspecified part of face: Secondary | ICD-10-CM | POA: Insufficient documentation

## 2014-07-26 DIAGNOSIS — K649 Unspecified hemorrhoids: Secondary | ICD-10-CM | POA: Insufficient documentation

## 2014-07-26 DIAGNOSIS — Z8619 Personal history of other infectious and parasitic diseases: Secondary | ICD-10-CM | POA: Insufficient documentation

## 2014-08-14 ENCOUNTER — Encounter: Payer: Self-pay | Admitting: *Deleted

## 2014-08-15 ENCOUNTER — Other Ambulatory Visit: Payer: Self-pay | Admitting: *Deleted

## 2014-08-15 ENCOUNTER — Ambulatory Visit (INDEPENDENT_AMBULATORY_CARE_PROVIDER_SITE_OTHER): Payer: 59 | Admitting: *Deleted

## 2014-08-15 DIAGNOSIS — I83899 Varicose veins of unspecified lower extremities with other complications: Secondary | ICD-10-CM

## 2014-08-15 DIAGNOSIS — I83893 Varicose veins of bilateral lower extremities with other complications: Secondary | ICD-10-CM

## 2014-08-15 NOTE — Progress Notes (Signed)
Patient came in for a "sclero check" having been referred by her PCP. She presents with bilateral varicose veins and spider veins scattered all over her legs. She says the varicose veins have been there since her 20"s (she is 39). She denies swelling but does complain of pain. She has put off treatment because she didn't want to go through stripping procedures. Today I measured her for thigh high compression stockings 20-30 and instructed her to wear them for the next three months. She will need a bilateral reflux study and new vv appt with Dr. Kellie Simmering. Her three month trial of conservative therapy will start today as she tries the stockings, tries Ibuprofen for pain, and tries elevation when possible. Pt expressed understanding and we will see her later in May for her initial visit with [facility]

## 2014-09-29 ENCOUNTER — Encounter: Payer: Self-pay | Admitting: Vascular Surgery

## 2014-10-03 ENCOUNTER — Ambulatory Visit (HOSPITAL_COMMUNITY)
Admission: RE | Admit: 2014-10-03 | Discharge: 2014-10-03 | Disposition: A | Discharge status: Home / Self Care | Payer: 59 | Source: Ambulatory Visit | Attending: Vascular Surgery | Admitting: Vascular Surgery

## 2014-10-03 ENCOUNTER — Ambulatory Visit (INDEPENDENT_AMBULATORY_CARE_PROVIDER_SITE_OTHER): Payer: 59 | Admitting: Vascular Surgery

## 2014-10-03 ENCOUNTER — Encounter: Payer: Self-pay | Admitting: Vascular Surgery

## 2014-10-03 ENCOUNTER — Other Ambulatory Visit: Payer: Self-pay | Admitting: Vascular Surgery

## 2014-10-03 VITALS — BP 160/108 | HR 72 | Resp 16 | Ht 63.0 in | Wt 173.0 lb

## 2014-10-03 DIAGNOSIS — I83893 Varicose veins of bilateral lower extremities with other complications: Secondary | ICD-10-CM

## 2014-10-03 DIAGNOSIS — I83899 Varicose veins of unspecified lower extremities with other complications: Secondary | ICD-10-CM | POA: Insufficient documentation

## 2014-10-03 DIAGNOSIS — I8393 Asymptomatic varicose veins of bilateral lower extremities: Secondary | ICD-10-CM | POA: Diagnosis present

## 2014-10-03 NOTE — Progress Notes (Signed)
Subjective:     Patient ID: Mallory Hines, female   DOB: 20-Mar-1951, 64 y.o.   MRN: [id number]  HPI this 64 year old female is evaluated for bilateral painful varicosities and chronic lower extremity edema. She states she has had varicose veins in both legs since she was a child left leg worse than right. These then became much worse after pregnancies. She has no history of DVT, thrombophlebitis, stasis ulcers, or bleeding. She develops aching throbbing and burning discomfort in these varicosities as the day progresses particularly in the lateral thigh and medial calf areas. She started wearing long leg elastic compression stockings 20-30 millimeter gradient and trying elevation and ibuprofen on 08/15/2014 with those being prescribed by our vein nurse. Her symptoms are continuing to affect her daily living.  Past Medical History  Diagnosis Date  . Diverticulosis of colon (without mention of hemorrhage)   . Skin cancer 4818,5631    right calf, face    History  Substance Use Topics  . Smoking status: Never Smoker   . Smokeless tobacco: Never Used  . Alcohol Use: Yes    Family History  Problem Relation Age of Onset  . Colon cancer Neg Hx   . Pancreatic cancer Mother   . Heart attack Father 53  . Diabetes Sister     Allergies  Allergen Reactions  . Celecoxib     REACTION: whelps  . Codeine     REACTION: does not like drug  . Meloxicam     REACTION: whelps  . Nsaids     REACTION: whelps     Current outpatient prescriptions:  .  benazepril (LOTENSIN) 20 MG tablet, TAKE 1 TABLET (20 MG TOTAL) BY MOUTH DAILY., Disp: 90 tablet, Rfl: 3 .  cefUROXime (CEFTIN) 250 MG tablet, Take 1 tablet (250 mg total) by mouth 2 (two) times daily with a meal. (Patient not taking: Reported on 10/03/2014), Disp: 20 tablet, Rfl: 0  Filed Vitals:   10/03/14 0938  BP: 160/108  Pulse: 72  Resp: 16  Height: 5\' 3"  (1.6 m)  Weight: 173 lb (78.472 kg)    Body mass index is 30.65  kg/(m^2).           Review of Systems denies chest pain, dyspnea on exertion, PND, orthopnea, hemoptysis. 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 Other systems negative.     Objective:   Physical Exam BP 160/108 mmHg  Pulse 72  Resp 16  Ht 5\' 3"  (1.6 m)  Wt 173 lb (78.472 kg)  BMI 30.65 kg/m2  Gen.-alert and oriented x3 in no apparent distress HEENT normal for age Lungs no rhonchi or wheezing Cardiovascular regular rhythm no murmurs carotid pulses 3+ palpable no bruits audible Abdomen soft nontender no palpable masses Musculoskeletal free of  major deformities Skin clear -no rashes Neurologic normal Lower extremities 3+ femoral and dorsalis pedis pulses palpable bilaterally with 1+ edema bilaterally Left leg with large bulging varicosities in the medial calf and lateral thigh and lateral calf area with diffuse spider and reticular veins in distal thigh and anterior distal tibial region on to dorsum of foot. No active ulceration noted. Right leg with bulging varicosities medial calf and lateral and anterior thigh with diffuse reticular and spider veins distal thigh and distal pretibial region onto anterior aspect of dorsum of foot. No active ulcers noted.  Today I ordered bilateral venous reflux study which I reviewed and interpreted. There is no DVT on either leg. Patient has gross reflux throughout  great saphenous systems bilaterally supplying these bulging varicosities. Small saphenous veins are free of reflux.       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 with diffuse reticular spider and bulging varicosities bilaterally which are affecting patient's daily living have been present and worsening for many years    Plan:         #1 long leg elastic compression stockings 20-30 mm gradient #2 elevate legs as much as possible #3 ibuprofen daily on a regular basis for pain #4  return July 12 which will complete a 3 month period-if no significant improvement then she will need number-one laser ablation left great saphenous vein plus greater than 20 stab phlebectomy +2 courses of sclerotherapy I'll abide #2 laser ablation right great saphenous vein plus greater than 20 stab phlebectomy followed by 2 courses sclerotherapy Patient to return July 12 which will complete her 3 month trial.

## 2014-11-10 ENCOUNTER — Encounter: Payer: Self-pay | Admitting: Vascular Surgery

## 2014-11-14 ENCOUNTER — Ambulatory Visit (INDEPENDENT_AMBULATORY_CARE_PROVIDER_SITE_OTHER): Payer: 59 | Admitting: Vascular Surgery

## 2014-11-14 ENCOUNTER — Encounter: Payer: Self-pay | Admitting: Vascular Surgery

## 2014-11-14 VITALS — BP 222/104 | HR 74 | Resp 18 | Ht 63.0 in | Wt 174.0 lb

## 2014-11-14 DIAGNOSIS — I83893 Varicose veins of bilateral lower extremities with other complications: Secondary | ICD-10-CM

## 2014-11-14 NOTE — Progress Notes (Signed)
Subjective:     Patient ID: Mallory Hines, female   DOB: 10/20/1950, 65 y.o.   MRN: [id number]  HPI this 64 year old female returns for continued follow-up regarding her painful varicosities and swelling in both lower extremities. She has tried long-leg elastic compression stockings 20-30 mm gradient as well as elevation ibuprofen over the past 3 months. She continues to have aching throbbing and burning discomfort with progressive edema as the day progresses. This is affecting her daily living and the symptoms are not improved.  Past Medical History  Diagnosis Date  . Diverticulosis of colon (without mention of hemorrhage)   . Skin cancer [id number]    right calf, face    History  Substance Use Topics  . Smoking status: Never Smoker   . Smokeless tobacco: Never Used  . Alcohol Use: Yes    Family History  Problem Relation Age of Onset  . Colon cancer Neg Hx   . Pancreatic cancer Mother   . Heart attack Father 70  . Diabetes Sister     Allergies  Allergen Reactions  . Celecoxib     REACTION: whelps  . Codeine     REACTION: does not like drug  . Meloxicam     REACTION: whelps  . Nsaids     REACTION: whelps     Current outpatient prescriptions:  .  benazepril (LOTENSIN) 20 MG tablet, TAKE 1 TABLET (20 MG TOTAL) BY MOUTH DAILY., Disp: 90 tablet, Rfl: 3 .  cefUROXime (CEFTIN) 250 MG tablet, Take 1 tablet (250 mg total) by mouth 2 (two) times daily with a meal. (Patient not taking: Reported on 10/03/2014), Disp: 20 tablet, Rfl: 0  Filed Vitals:   11/14/14 1123 11/14/14 1126  BP: 207/96 222/104  Pulse: 74   Resp: 18   Height: 5\' 3"  (1.6 m)   Weight: 174 lb (78.926 kg)     Body mass index is 30.83 kg/(m^2).           Review of Systems history of melanoma right calf with skin graft. Denies chest pain, dyspnea on exertion, PND, orthopnea, hemoptysis.     Objective:   Physical Exam BP 222/104 mmHg  Pulse 74  Resp 18  Ht 5\' 3"  (1.6 m)  Wt 174 lb (78.926 kg)   BMI 30.83 kg/m2  Gen. well-developed well-nourished female in no apparent stress alert and oriented 3 Lungs no rhonchi or wheezing Both legs with extensive bulging varicosities and anterior thigh medial thigh medial calf with extensive network of reticular veins lower third both legs with hyperpigmentation but no active ulceration. 3+ dorsalis pedis pulse palpable bilaterally.  Patient has documented gross refluxing great saphenous vein supplying these extensive varicosities in both lower extremities with no DVT     Assessment:     Painful varicosities bilaterally with chronic edema due to gross reflux bilateral great saphenous veins. Patient's symptoms are affecting her daily living and are resistant to conservative measures. Patient    Plan:     Patient needs #1 laser ablation right great saphenous vein plus greater than 20 stab phlebectomy followed by #2 laser ablation left great saphenous vein plus greater than 20 stab phlebectomy followed by 2 courses of sclerotherapy in each leg for residual painful reticular and small varicosities in each lower extremity

## 2014-11-14 NOTE — Progress Notes (Signed)
Filed Vitals:   11/14/14 1123 11/14/14 1126  BP: 207/96 222/104  Pulse: 74   Resp: 18   Height: 5\' 3"  (1.6 m)   Weight: 174 lb (78.926 kg)

## 2014-11-21 ENCOUNTER — Other Ambulatory Visit: Payer: Self-pay | Admitting: *Deleted

## 2014-11-21 DIAGNOSIS — I83893 Varicose veins of bilateral lower extremities with other complications: Secondary | ICD-10-CM

## 2014-12-13 ENCOUNTER — Encounter (HOSPITAL_COMMUNITY): Payer: 59

## 2014-12-13 ENCOUNTER — Ambulatory Visit: Payer: 59 | Admitting: Vascular Surgery

## 2014-12-29 ENCOUNTER — Encounter: Payer: Self-pay | Admitting: Vascular Surgery

## 2015-01-01 ENCOUNTER — Ambulatory Visit (INDEPENDENT_AMBULATORY_CARE_PROVIDER_SITE_OTHER): Payer: 59 | Admitting: Vascular Surgery

## 2015-01-01 ENCOUNTER — Encounter: Payer: Self-pay | Admitting: Vascular Surgery

## 2015-01-01 VITALS — BP 173/82 | HR 73 | Temp 97.8°F | Resp 16 | Ht 63.0 in | Wt 175.0 lb

## 2015-01-01 DIAGNOSIS — I83893 Varicose veins of bilateral lower extremities with other complications: Secondary | ICD-10-CM | POA: Diagnosis not present

## 2015-01-01 NOTE — Progress Notes (Signed)
Laser Ablation Procedure    Date: 01/01/2015   Mallory Hines DOB:[birth year]  Consent signed: Yes    Surgeon:  Dr. Nelda Severe. Kellie Simmering  Procedure: Laser Ablation: right Greater Saphenous Vein  BP 173/82 mmHg  Pulse 73  Temp(Src) 97.8 F (36.6 C)  Resp 16  Ht 5\' 3"  (1.6 m)  Wt 175 lb (79.379 kg)  BMI 31.01 kg/m2  SpO2 100%  Tumescent Anesthesia: 350 cc 0.9% NaCl with 50 cc Lidocaine HCL with 1% Epi and 15 cc 8.4% NaHCO3  Local Anesthesia: 8 cc Lidocaine HCL and NaHCO3 (ratio 2:1)  Pulsed Mode: 15 watts, 561ms delay, 1.0 duration  Total Energy:1047              Total Pulses: 70               Total Time: 1:10    Stab Phlebectomy: >20 Sites: Thigh and Calf  Patient tolerated procedure well  Notes:   Description of Procedure:  After marking the course of the secondary varicosities, the patient was placed on the operating table in the supine position, and the right leg was prepped and draped in sterile fashion.   Local anesthetic was administered and under ultrasound guidance the saphenous vein was accessed with a micro needle and guide wire; then the mirco puncture sheath was placed.  A guide wire was inserted saphenofemoral junction , followed by a 5 french sheath.  The position of the sheath and then the laser fiber below the junction was confirmed using the ultrasound.  Tumescent anesthesia was administered along the course of the saphenous vein using ultrasound guidance. The patient was placed in Trendelenburg position and protective laser glasses were placed on patient and staff, and the laser was fired at 15 watts continuous mode advancing 1-41mm/second for a total of 1047 joules.   For stab phlebectomies, local anesthetic was administered at the previously marked varicosities, and tumescent anesthesia was administered around the vessels.  Greater than 20 stab wounds were made using the tip of an 11 blade. And using the vein hook, the phlebectomies were performed using a hemostat to  avulse the varicosities.  Adequate hemostasis was achieved.     Steri strips were applied to the stab wounds and ABD pads and thigh high compression stockings were applied.  Ace wrap bandages were applied over the phlebectomy sites and at the top of the saphenofemoral junction. Blood loss was less than 15 cc.  The patient ambulated out of the operating room having tolerated the procedure well.

## 2015-01-01 NOTE — Progress Notes (Signed)
Filed Vitals:   01/01/15 1252 01/01/15 1253  BP: 180/87 173/82  Pulse: 81 73  Temp: 97.8 F (36.6 C)   Resp: 16   Height: 5\' 3"  (1.6 m)   Weight: 175 lb (79.379 kg)   SpO2: 100%

## 2015-01-01 NOTE — Progress Notes (Signed)
Subjective:     Patient ID: Mallory Hines, female   DOB: 1950/09/13, 64 y.o.   MRN: [id number]  HPI this 64 year old female had laser ablation right great saphenous vein from the distal thigh to near the saphenofemoral junction performed under local tumescent anesthesia plus greater than 20 stab phlebectomy of painful varicosities. She tolerated both procedures well.   Review of Systems     Objective:   Physical Exam BP 173/82 mmHg  Pulse 73  Temp(Src) 97.8 F (36.6 C)  Resp 16  Ht 5\' 3"  (1.6 m)  Wt 175 lb (79.379 kg)  BMI 31.01 kg/m2  SpO2 100%       Assessment:     Well-tolerated laser ablation right great saphenous vein plus greater than 20 stab phlebectomy of painful varicosities performed under local tumescent anesthesia    Plan:     Return next week for venous duplex exam to confirm closure right great saphenous vein. She will then have similar procedure on contralateral left leg in the near future.

## 2015-01-02 ENCOUNTER — Encounter: Payer: Self-pay | Admitting: Vascular Surgery

## 2015-01-02 ENCOUNTER — Telehealth: Payer: Self-pay | Admitting: *Deleted

## 2015-01-02 NOTE — Telephone Encounter (Signed)
Pt doing well. No pain or bleeding. Following all instructions. She will see Dr. Scot Dock next wed.

## 2015-01-09 ENCOUNTER — Encounter: Payer: Self-pay | Admitting: Vascular Surgery

## 2015-01-10 ENCOUNTER — Encounter: Payer: Self-pay | Admitting: Vascular Surgery

## 2015-01-10 ENCOUNTER — Ambulatory Visit (INDEPENDENT_AMBULATORY_CARE_PROVIDER_SITE_OTHER): Payer: Self-pay | Admitting: Vascular Surgery

## 2015-01-10 ENCOUNTER — Ambulatory Visit (HOSPITAL_COMMUNITY)
Admission: RE | Admit: 2015-01-10 | Discharge: 2015-01-10 | Disposition: A | Discharge status: Home / Self Care | Payer: 59 | Source: Ambulatory Visit | Attending: Vascular Surgery | Admitting: Vascular Surgery

## 2015-01-10 VITALS — BP 149/86 | HR 75 | Temp 98.2°F | Resp 16 | Ht 64.0 in | Wt 175.0 lb

## 2015-01-10 DIAGNOSIS — I83893 Varicose veins of bilateral lower extremities with other complications: Secondary | ICD-10-CM

## 2015-01-10 DIAGNOSIS — I872 Venous insufficiency (chronic) (peripheral): Secondary | ICD-10-CM

## 2015-01-10 NOTE — Progress Notes (Signed)
Filed Vitals:   01/10/15 1406 01/10/15 1409  BP: 172/91 149/86  Pulse: 97 75  Temp: 98.2 F (36.8 C)   Resp: 16   Height: 5\' 4"  (1.626 m)   Weight: 175 lb (79.379 kg)   SpO2: 97%

## 2015-01-10 NOTE — Progress Notes (Signed)
   Patient name: Mallory Hines MRN: [id number] DOB: 1950/10/08 Sex: female  REASON FOR VISIT: follow up after laser ablation of the right great saphenous vein and stab phlebectomy.  HPI: Mallory Hines is a 64 y.o. female who underwent laser ablation of the right great saphenous vein and 20 stabs lobectomies for painful varicose veins of the right lower extremity. This was done by Dr. Kellie Simmering on 01/01/2015. She has done well and has no specific complaints. She's had no significant swelling or pain.  Current Outpatient Prescriptions  Medication Sig Dispense Refill  . benazepril (LOTENSIN) 20 MG tablet TAKE 1 TABLET (20 MG TOTAL) BY MOUTH DAILY. 90 tablet 3  . cefUROXime (CEFTIN) 250 MG tablet Take 1 tablet (250 mg total) by mouth 2 (two) times daily with a meal. (Patient not taking: Reported on 10/03/2014) 20 tablet 0   No current facility-administered medications for this visit.   REVIEW OF SYSTEMS: Valu.Nieves ] denotes positive finding; [  ] denotes negative finding  CARDIOVASCULAR:  [ ]  chest pain   [ ]  dyspnea on exertion    CONSTITUTIONAL:  [ ]  fever   [ ]  chills  PHYSICAL EXAM: Filed Vitals:   01/10/15 1406 01/10/15 1409  BP: 172/91 149/86  Pulse: 97 75  Temp: 98.2 F (36.8 C)   Resp: 16   Height: 5\' 4"  (1.626 m)   Weight: 175 lb (79.379 kg)   SpO2: 97%    GENERAL: The patient is a well-nourished female, in no acute distress. The vital signs are documented above. CARDIOVASCULAR: There is a regular rate and rhythm. PULMONARY: There is good air exchange bilaterally without wheezing or rales. Her stab phlebectomy sites are all healing nicely. She has no significant swelling.  MEDICAL ISSUES: The patient is doing well status post laser ablation of the right great saphenous vein and stab phlebectomy's. She is scheduled to have laser ablation of the left great saphenous vein on 01/22/2015.   HYPERTENSION: The patient's initial blood pressure today was elevated. We repeated this and this was  still elevated. We have encouraged the patient to follow up with their primary care physician for management of their blood pressure.   Deitra Mayo [facility]: [phone]

## 2015-01-19 ENCOUNTER — Encounter: Payer: Self-pay | Admitting: Vascular Surgery

## 2015-01-22 ENCOUNTER — Ambulatory Visit (INDEPENDENT_AMBULATORY_CARE_PROVIDER_SITE_OTHER): Payer: 59 | Admitting: Vascular Surgery

## 2015-01-22 ENCOUNTER — Encounter: Payer: Self-pay | Admitting: Vascular Surgery

## 2015-01-22 VITALS — BP 186/88 | HR 76 | Temp 98.0°F | Resp 16 | Ht 64.0 in | Wt 175.0 lb

## 2015-01-22 DIAGNOSIS — I83893 Varicose veins of bilateral lower extremities with other complications: Secondary | ICD-10-CM | POA: Diagnosis not present

## 2015-01-22 NOTE — Progress Notes (Signed)
Laser Ablation Procedure    Date: 01/22/2015   JERENE YEAGER DOB:1950-07-20  Consent signed: Yes    Surgeon:  Dr. Nelda Severe. Kellie Simmering  Procedure: Laser Ablation: left Greater Saphenous Vein  BP 186/88 mmHg  Pulse 76  Temp(Src) 98 F (36.7 C)  Resp 16  Ht 5\' 4"  (1.626 m)  Wt 175 lb (79.379 kg)  BMI 30.02 kg/m2  SpO2 100%  Tumescent Anesthesia: 475 cc 0.9% NaCl with 50 cc Lidocaine HCL with 1% Epi and 15 cc 8.4% NaHCO3  Local Anesthesia: 15 cc Lidocaine HCL and NaHCO3 (ratio 2:1)  Pulsed Mode: 15 watts, 534ms delay, 1.0 duration  Total Energy: 1841             Total Pulses:     123           Total Time: 2:03    Stab Phlebectomy: >20 Sites: Thigh and Calf  Patient tolerated procedure well  Notes:   Description of Procedure:  After marking the course of the secondary varicosities, the patient was placed on the operating table in the supine position, and the left leg was prepped and draped in sterile fashion.   Local anesthetic was administered and under ultrasound guidance the saphenous vein was accessed with a micro needle and guide wire; then the mirco puncture sheath was placed.  A guide wire was inserted saphenofemoral junction , followed by a 5 french sheath.  The position of the sheath and then the laser fiber below the junction was confirmed using the ultrasound.  Tumescent anesthesia was administered along the course of the saphenous vein using ultrasound guidance. The patient was placed in Trendelenburg position and protective laser glasses were placed on patient and staff, and the laser was fired at 15 watts continuous mode advancing 1-29mm/second for a total of 1841 joules.   For stab phlebectomies, local anesthetic was administered at the previously marked varicosities, and tumescent anesthesia was administered around the vessels.  Greater than 20 stab wounds were made using the tip of an 11 blade. And using the vein hook, the phlebectomies were performed using a hemostat to  avulse the varicosities.  Adequate hemostasis was achieved.     Steri strips were applied to the stab wounds and ABD pads and thigh high compression stockings were applied.  Ace wrap bandages were applied over the phlebectomy sites and at the top of the saphenofemoral junction. Blood loss was less than 15 cc.  The patient ambulated out of the operating room having tolerated the procedure well.

## 2015-01-22 NOTE — Progress Notes (Signed)
Subjective:     Patient ID: Mallory Hines, female   DOB: [birth year], 64 y.o.   MRN: [id number]  HPI 64-year-old female had laser ablation left great saphenous vein from the distal thigh to near the saphenofemoral junction plus approximately 30 stab phlebectomy of painful varicosities performed under local tumescent anesthesia. A total of 1841 J of energy was utilized. She tolerated the procedure well.   Review of Systems     Objective:   Physical Exam BP 186/88 mmHg  Pulse 76  Temp(Src) 98 F (36.7 C)  Resp 16  Ht 5\' 4"  (1.626 m)  Wt 175 lb (79.379 kg)  BMI 30.02 kg/m2  SpO2 100%       Assessment:     Well-tolerated laser ablation left great saphenous vein plus greater than 20 stab phlebectomy of painful varicosities performed under local tumescent anesthesia    Plan:     Return in 1 week for venous duplex exam to confirm closure left great saphenous vein Patient has sclerotherapy appointments set up in the near future

## 2015-01-22 NOTE — Progress Notes (Signed)
Filed Vitals:   01/22/15 0843 01/22/15 0845  BP: 175/87 186/88  Pulse: 76   Temp: 98 F (36.7 C)   Resp: 16   Height: 5\' 4"  (1.626 m)   Weight: 175 lb (79.379 kg)   SpO2: 100%

## 2015-01-23 ENCOUNTER — Telehealth: Payer: Self-pay | Admitting: *Deleted

## 2015-01-23 NOTE — Telephone Encounter (Signed)
Pt doing well. No problems. Following all instructions. 

## 2015-01-24 ENCOUNTER — Other Ambulatory Visit: Payer: Self-pay | Admitting: Family

## 2015-01-24 ENCOUNTER — Encounter: Payer: Self-pay | Admitting: Vascular Surgery

## 2015-01-29 ENCOUNTER — Ambulatory Visit (HOSPITAL_COMMUNITY)
Admission: RE | Admit: 2015-01-29 | Discharge: 2015-01-29 | Disposition: A | Discharge status: Home / Self Care | Payer: 59 | Source: Ambulatory Visit | Attending: Vascular Surgery | Admitting: Vascular Surgery

## 2015-01-29 ENCOUNTER — Encounter: Payer: Self-pay | Admitting: Vascular Surgery

## 2015-01-29 ENCOUNTER — Ambulatory Visit: Payer: 59 | Admitting: Vascular Surgery

## 2015-01-29 ENCOUNTER — Ambulatory Visit (INDEPENDENT_AMBULATORY_CARE_PROVIDER_SITE_OTHER): Payer: 59 | Admitting: Vascular Surgery

## 2015-01-29 VITALS — BP 173/89 | HR 65 | Temp 98.3°F | Resp 14 | Ht 64.0 in | Wt 175.0 lb

## 2015-01-29 DIAGNOSIS — I83892 Varicose veins of left lower extremities with other complications: Secondary | ICD-10-CM

## 2015-01-29 DIAGNOSIS — I83893 Varicose veins of bilateral lower extremities with other complications: Secondary | ICD-10-CM

## 2015-01-29 NOTE — Progress Notes (Signed)
Subjective:     Patient ID: Mallory Hines, female   DOB: [birth year], 64 y.o.   MRN: [id number]  HPI this 64 year old female returns 1 week post-laser ablation left great saphenous vein plus approximately 30 stab phlebectomy of painful varicosities performed under local tumescent anesthesia. She has had some mild-to-moderate discomfort along the course of the great saphenous vein in the mid thigh area. This is beginning to resolve. She's had no distal edema. She has worn her elastic compression stockings and take ibuprofen as instructed. She has no specific complaints. She denies chest pain dyspnea on exertion or hemoptysis. Review of Systems     Objective:   Physical Exam BP 173/89 mmHg  Pulse 65  Temp(Src) 98.3 F (36.8 C)  Resp 14  Ht 5\' 4"  (1.626 m)  Wt 175 lb (79.379 kg)  BMI 30.02 kg/m2  SpO2 95%  Gen. well-developed well-nourished female in no apparent distress alert and oriented 3 Left leg with mild discomfort to palpation of great saphenous vein. Multiple stab phlebectomy sites are healing nicely with Steri-Strips in place. No distal edema noted. 3+ dorsalis pedis pulse palpable.  Today I ordered a venous duplex exam of the left leg which I reviewed and interpreted. There is no DVT. The left great saphenous vein is totally closed from near the saphenofemoral junction to the distal thigh     Assessment:     Successful bilateral laser ablation great saphenous vein with multiple stab phlebectomy of painful varicosities with good result    Plan:     Patient will return in the near future for sclerotherapy of extensive reticular and spider veins bilateral lower extremities.

## 2015-02-20 ENCOUNTER — Ambulatory Visit (INDEPENDENT_AMBULATORY_CARE_PROVIDER_SITE_OTHER): Payer: 59 | Admitting: Family

## 2015-02-20 ENCOUNTER — Other Ambulatory Visit: Payer: 59

## 2015-02-20 ENCOUNTER — Encounter: Payer: Self-pay | Admitting: Family

## 2015-02-20 VITALS — BP 134/82 | HR 80 | Temp 98.4°F | Resp 18 | Ht 64.0 in | Wt 175.0 lb

## 2015-02-20 DIAGNOSIS — R350 Frequency of micturition: Secondary | ICD-10-CM | POA: Diagnosis not present

## 2015-02-20 DIAGNOSIS — N3090 Cystitis, unspecified without hematuria: Secondary | ICD-10-CM

## 2015-02-20 LAB — POCT URINALYSIS DIPSTICK
BILIRUBIN UA: NEGATIVE
GLUCOSE UA: NEGATIVE
KETONES UA: NEGATIVE
Nitrite, UA: NEGATIVE
PH UA: 6
RBC UA: NEGATIVE
Urobilinogen, UA: NEGATIVE

## 2015-02-20 MED ORDER — FLUCONAZOLE 150 MG PO TABS: 150.0000 mg | ORAL_TABLET | Freq: Once | ORAL | Status: DC

## 2015-02-20 MED ORDER — CIPROFLOXACIN HCL 250 MG PO TABS: 250.0000 mg | ORAL_TABLET | Freq: Two times a day (BID) | ORAL | Status: DC

## 2015-02-20 NOTE — Progress Notes (Signed)
Pre visit review using our clinic review tool, if applicable. No additional management support is needed unless otherwise documented below in the visit note. 

## 2015-02-20 NOTE — Progress Notes (Signed)
   Subjective:    Patient ID: Mallory Hines, female    DOB: 12-13-1950, 64 y.o.   MRN: [id number]  Chief Complaint  Patient presents with  . Urinary Tract Infection    urinary pressure, has a little blood, urinary frequency    HPI:  Mallory Hines is a 64 y.o. female who  has a past medical history of Diverticulosis of colon (without mention of hemorrhage); Skin cancer (7618,4859); and Varicose veins. and presents today for an acute office visit.  This is a new problem. Associated symptoms of urinary pressure, frequency and some hematuria have been going on for about 1 day. Denies any fevers, chills, flank pain, or dysuria. Denies any modifying factors in attempts to treat.   Allergies  Allergen Reactions  . Celecoxib     REACTION: whelps  . Codeine     REACTION: does not like drug  . Meloxicam     REACTION: whelps  . Nsaids     REACTION: whelps     Current Outpatient Prescriptions on File Prior to Visit  Medication Sig Dispense Refill  . benazepril (LOTENSIN) 20 MG tablet TAKE 1 TABLET (20 MG TOTAL) BY MOUTH DAILY. 30 tablet 0   No current facility-administered medications on file prior to visit.    Review of Systems  Constitutional: Negative for fever and chills.  Genitourinary: Positive for frequency and hematuria. Negative for dysuria, urgency and flank pain.      Objective:    BP 134/82 mmHg  Pulse 80  Temp(Src) 98.4 F (36.9 C) (Oral)  Resp 18  Ht 5\' 4"  (1.626 m)  Wt 175 lb (79.379 kg)  BMI 30.02 kg/m2  SpO2 97% Nursing note and vital signs reviewed.  Physical Exam  Constitutional: She is oriented to person, place, and time. She appears well-developed and well-nourished. No distress.  Cardiovascular: Normal rate, regular rhythm, normal heart sounds and intact distal pulses.   Pulmonary/Chest: Effort normal and breath sounds normal.  Abdominal: There is no CVA tenderness.  Neurological: She is alert and oriented to person, place, and time.  Skin: Skin is  warm and dry.  Psychiatric: She has a normal mood and affect. Her behavior is normal. Judgment and thought content normal.       Assessment & Plan:   Problem List Items Addressed This Visit      Genitourinary   Cystitis - Primary    In office UA positive for leukocytes and negative for hematuria or nitrites which is consistent for UTI. Urine sent for culture. Start Ciprofloxacin. Start Diflucan as needed for post antibiotic candidiasis. Drink plenty of fluids. Follow up if symptoms worsen or fail to improve.        Other Visit Diagnoses    Urinary frequency        Relevant Orders    POCT urinalysis dipstick (Completed)    Urine culture

## 2015-02-20 NOTE — Assessment & Plan Note (Signed)
In office UA positive for leukocytes and negative for hematuria or nitrites which is consistent for UTI. Urine sent for culture. Start Ciprofloxacin. Start Diflucan as needed for post antibiotic candidiasis. Drink plenty of fluids. Follow up if symptoms worsen or fail to improve.

## 2015-02-20 NOTE — Patient Instructions (Signed)
Thank you for choosing [facility].  Summary/Instructions:  Your prescription(s) have been submitted to your pharmacy or been printed and provided for you. Please take as directed and contact our office if you believe you are having problem(s) with the medication(s) or have any questions.  If your symptoms worsen or fail to improve, please contact our office for further instruction, or in case of emergency go directly to the emergency room at the closest medical facility.   Urinary Tract Infection Urinary tract infections (UTIs) can develop anywhere along your urinary tract. Your urinary tract is your body's drainage system for removing wastes and extra water. Your urinary tract includes two kidneys, two ureters, a bladder, and a urethra. Your kidneys are a pair of bean-shaped organs. Each kidney is about the size of your fist. They are located below your ribs, one on each side of your spine. CAUSES Infections are caused by microbes, which are microscopic organisms, including fungi, viruses, and bacteria. These organisms are so small that they can only be seen through a microscope. Bacteria are the microbes that most commonly cause UTIs. SYMPTOMS  Symptoms of UTIs may vary by age and gender of the patient and by the location of the infection. Symptoms in young women typically include a frequent and intense urge to urinate and a painful, burning feeling in the bladder or urethra during urination. Older women and men are more likely to be tired, shaky, and weak and have muscle aches and abdominal pain. A fever may mean the infection is in your kidneys. Other symptoms of a kidney infection include pain in your back or sides below the ribs, nausea, and vomiting. DIAGNOSIS To diagnose a UTI, your caregiver will ask you about your symptoms. Your caregiver will also ask you to provide a urine sample. The urine sample will be tested for bacteria and white blood cells. White blood cells are made by your  body to help fight infection. TREATMENT  Typically, UTIs can be treated with medication. Because most UTIs are caused by a bacterial infection, they usually can be treated with the use of antibiotics. The choice of antibiotic and length of treatment depend on your symptoms and the type of bacteria causing your infection. HOME CARE INSTRUCTIONS  If you were prescribed antibiotics, take them exactly as your caregiver instructs you. Finish the medication even if you feel better after you have only taken some of the medication.  Drink enough water and fluids to keep your urine clear or pale yellow.  Avoid caffeine, tea, and carbonated beverages. They tend to irritate your bladder.  Empty your bladder often. Avoid holding urine for long periods of time.  Empty your bladder before and after sexual intercourse.  After a bowel movement, women should cleanse from front to back. Use each tissue only once. SEEK MEDICAL CARE IF:   You have back pain.  You develop a fever.  Your symptoms do not begin to resolve within 3 days. SEEK IMMEDIATE MEDICAL CARE IF:   You have severe back pain or lower abdominal pain.  You develop chills.  You have nausea or vomiting.  You have continued burning or discomfort with urination. MAKE SURE YOU:   Understand these instructions.  Will watch your condition.  Will get help right away if you are not doing well or get worse.   This information is not intended to replace advice given to you by your health care provider. Make sure you discuss any questions you have with your health care   provider.   Document Released: 01/29/2005 Document Revised: 01/10/2015 Document Reviewed: 05/30/2011 Elsevier Interactive Patient Education 2016 [employer]  

## 2015-02-22 ENCOUNTER — Telehealth: Payer: Self-pay | Admitting: Family

## 2015-02-22 LAB — URINE CULTURE

## 2015-02-22 NOTE — Telephone Encounter (Signed)
Please inform patient that her urine culture did show a urinary tract infection and the antibiotic was appropriate. Please follow up if any symptoms continue.

## 2015-02-23 NOTE — Telephone Encounter (Signed)
LVM for pt to call back.

## 2015-02-26 NOTE — Telephone Encounter (Signed)
Results sent in the mail. 

## 2015-02-28 ENCOUNTER — Ambulatory Visit: Payer: 59 | Admitting: *Deleted

## 2015-03-05 ENCOUNTER — Encounter: Payer: Self-pay | Admitting: *Deleted

## 2015-03-07 ENCOUNTER — Ambulatory Visit (INDEPENDENT_AMBULATORY_CARE_PROVIDER_SITE_OTHER): Payer: 59 | Admitting: *Deleted

## 2015-03-07 DIAGNOSIS — I83893 Varicose veins of bilateral lower extremities with other complications: Secondary | ICD-10-CM | POA: Diagnosis not present

## 2015-03-07 NOTE — Progress Notes (Signed)
X=.3% Sotradecol administered with a 27g butterfly.  Patient received a total of 24cc.  Treated as many areas as I could with 4 syringes. Tolerated very well. Easy access. Anticipate good results. One more tx scheduled for Dec.   Photos: Yes.    Compression stockings applied: Yes.  and ace wraps around the feet/ankles

## 2015-03-20 ENCOUNTER — Ambulatory Visit (INDEPENDENT_AMBULATORY_CARE_PROVIDER_SITE_OTHER): Payer: 59 | Admitting: *Deleted

## 2015-03-20 DIAGNOSIS — I8393 Asymptomatic varicose veins of bilateral lower extremities: Secondary | ICD-10-CM | POA: Insufficient documentation

## 2015-03-20 NOTE — Progress Notes (Signed)
Pt thought she had some blood blisters from her previous sclero treatment and that they need to be popped, but they were just scabs. There was nothing to pop and express. She comes for another sclero treatment in a few weeks.

## 2015-04-06 ENCOUNTER — Encounter: Payer: Self-pay | Admitting: *Deleted

## 2015-04-11 ENCOUNTER — Ambulatory Visit: Payer: 59 | Admitting: *Deleted

## 2015-04-11 ENCOUNTER — Ambulatory Visit (INDEPENDENT_AMBULATORY_CARE_PROVIDER_SITE_OTHER): Payer: 59 | Admitting: *Deleted

## 2015-04-11 DIAGNOSIS — I8393 Asymptomatic varicose veins of bilateral lower extremities: Secondary | ICD-10-CM

## 2015-04-11 DIAGNOSIS — I83893 Varicose veins of bilateral lower extremities with other complications: Secondary | ICD-10-CM

## 2015-04-11 NOTE — Progress Notes (Signed)
X=.3% Sotradecol administered with a 27g butterfly.  Patient received a total of 24cc. Treated any remaining veins with 4 syringes. Unable to do any work on the back. Easy access. Tol well except for burning and itching which resolved. This is her last ins covered tx. Follow prn. She will need more sessions.   Photos: No.  Compression stockings applied: Yes.

## 2015-04-12 ENCOUNTER — Encounter: Payer: Self-pay | Admitting: Vascular Surgery

## 2015-06-23 ENCOUNTER — Other Ambulatory Visit: Payer: Self-pay | Admitting: Family

## 2015-06-25 NOTE — Telephone Encounter (Signed)
i don't see recent OV addressing hypertension---please advise on refill request, thanks

## 2015-08-20 ENCOUNTER — Other Ambulatory Visit: Payer: Self-pay | Admitting: Family

## 2015-08-20 NOTE — Telephone Encounter (Signed)
Please advise 

## 2015-10-01 ENCOUNTER — Ambulatory Visit (HOSPITAL_COMMUNITY)
Admission: EM | Admit: 2015-10-01 | Discharge: 2015-10-01 | Disposition: A | Discharge status: Home / Self Care | Payer: Managed Care, Other (non HMO) | Attending: Family Medicine | Admitting: Family Medicine

## 2015-10-01 ENCOUNTER — Encounter (HOSPITAL_COMMUNITY): Payer: Self-pay | Admitting: Emergency Medicine

## 2015-10-01 DIAGNOSIS — N390 Urinary tract infection, site not specified: Secondary | ICD-10-CM | POA: Diagnosis not present

## 2015-10-01 DIAGNOSIS — R350 Frequency of micturition: Secondary | ICD-10-CM | POA: Diagnosis present

## 2015-10-01 LAB — POCT URINALYSIS DIP (DEVICE)
Bilirubin Urine: NEGATIVE
Glucose, UA: NEGATIVE mg/dL
KETONES UR: NEGATIVE mg/dL
Nitrite: NEGATIVE
PH: 5.5 (ref 5.0–8.0)
PROTEIN: 100 mg/dL — AB
Urobilinogen, UA: 0.2 mg/dL (ref 0.0–1.0)

## 2015-10-01 MED ORDER — CIPROFLOXACIN HCL 250 MG PO TABS: 250.0000 mg | ORAL_TABLET | Freq: Two times a day (BID) | ORAL | Status: DC

## 2015-10-01 MED ORDER — PHENAZOPYRIDINE HCL 200 MG PO TABS
200.0000 mg | ORAL_TABLET | Freq: Three times a day (TID) | ORAL | Status: DC
Start: 2015-10-01 — End: 2016-07-08

## 2015-10-01 NOTE — ED Notes (Signed)
The patient presented to the [facility] with a complaint of urinary pressure and urinary odor and frequency that started x 3 days.

## 2015-10-01 NOTE — ED Provider Notes (Signed)
CSN: [id number]     Arrival date & time 10/01/15  1833 History   First MD Initiated Contact with Patient 10/01/15 1919     Chief Complaint  Patient presents with  . Urinary Frequency   (Consider location/radiation/quality/duration/timing/severity/associated sxs/prior Treatment) HPI History obtained from patient:   I have a urinary tract infection  YESTERDAY began to have frequency of urination 5-6 times per day.  Some odor to her urine. Dark in color. No other symptoms. No fever, back pain, hematuria.  No home treatment.  Past Medical History  Diagnosis Date  . Diverticulosis of colon (without mention of hemorrhage)   . Skin cancer [id number]    right calf, face  . Varicose veins    Past Surgical History  Procedure Laterality Date  . Cesarean section  1985  . Foot surgery Right     3 rd toe  . Colonoscopy    . Flexible sigmoidoscopy     Family History  Problem Relation Age of Onset  . Colon cancer Neg Hx   . Pancreatic cancer Mother   . Heart attack Father 41  . Diabetes Sister    Social History  Substance Use Topics  . Smoking status: Never Smoker   . Smokeless tobacco: Never Used  . Alcohol Use: 0.0 oz/week    0 Standard drinks or equivalent per week   OB History    No data available     Review of Systems  Denies: HEADACHE, NAUSEA, ABDOMINAL PAIN, CHEST PAIN, CONGESTION,  SHORTNESS OF BREATH  Allergies  Celecoxib; Codeine; Meloxicam; and Nsaids  Home Medications   Prior to Admission medications   Medication Sig Start Date End Date Taking? Authorizing Provider  benazepril (LOTENSIN) 20 MG tablet TAKE 1 TABLET (20 MG TOTAL) BY MOUTH DAILY. 06/25/15  Yes Golden Circle, FNP  ciprofloxacin (CIPRO) 250 MG tablet Take 1 tablet (250 mg total) by mouth 2 (two) times daily. 02/20/15   Golden Circle, FNP  fluconazole (DIFLUCAN) 150 MG tablet TAKE 1 TABLET (150 MG TOTAL) BY MOUTH ONCE. 08/20/15   Golden Circle, FNP   Meds Ordered and Administered this Visit   Medications - No data to display  BP 186/72 mmHg  Pulse 91  Temp(Src) 98.4 F (36.9 C) (Oral)  Resp 16  SpO2 100% No data found.   Physical Exam NURSES NOTES AND VITAL SIGNS REVIEWED. CONSTITUTIONAL: Well developed, well nourished, no acute distress HEENT: normocephalic, atraumatic EYES: Conjunctiva normal NECK:normal ROM, supple, no adenopathy PULMONARY:No respiratory distress, normal effort ABDOMINAL: Soft, ND, NT BS+, No CVAT MUSCULOSKELETAL: Normal ROM of all extremities,  SKIN: warm and dry without rash PSYCHIATRIC: Mood and affect, behavior are normal  ED Course  Procedures (including critical care time)  Labs Review Labs Reviewed - No data to display  Imaging Review No results found.   Visual Acuity Review  Right Eye Distance:   Left Eye Distance:   Bilateral Distance:    Right Eye Near:   Left Eye Near:    Bilateral Near:         MDM   1. UTI (lower urinary tract infection)     Patient is reassured that there are no issues that require transfer to higher level of care at this time or additional tests. Patient is advised to continue home symptomatic treatment. Patient is advised that if there are new or worsening symptoms to attend the emergency department, contact primary care provider, or return to UC. Instructions of care provided discharged home in  stable condition.    THIS NOTE WAS GENERATED USING A VOICE RECOGNITION SOFTWARE PROGRAM. ALL REASONABLE EFFORTS  WERE MADE TO PROOFREAD THIS DOCUMENT FOR ACCURACY.  I have verbally reviewed the discharge instructions with the patient. A printed AVS was given to the patient.  All questions were answered prior to discharge.      Konrad Felix, PA 10/01/15 2012

## 2015-10-01 NOTE — Discharge Instructions (Signed)

## 2015-10-03 LAB — URINE CULTURE

## 2015-10-09 ENCOUNTER — Telehealth (HOSPITAL_COMMUNITY): Payer: Self-pay | Admitting: Emergency Medicine

## 2015-10-09 NOTE — ED Notes (Signed)
Called pt and notified of recent lab results from visit 5/29 Pt ID'd properly... Reports feeling better and sx have subsided  Per Dr. Valere Dross,  Notes Recorded by Sherlene Shams, MD on 10/05/2015 at 9:20 AM Clinical staff, please let patient know that urine culture did not clearly demonstrate a UTI. Recheck for further evaluation if urinary discomfort persists. LM  Adv pt if sx are not getting better to return  Pt verb understanding

## 2016-01-27 ENCOUNTER — Other Ambulatory Visit: Payer: Self-pay | Admitting: Family

## 2016-05-01 ENCOUNTER — Ambulatory Visit (INDEPENDENT_AMBULATORY_CARE_PROVIDER_SITE_OTHER): Payer: Self-pay | Admitting: Family

## 2016-05-01 VITALS — BP 160/98 | HR 100 | Temp 99.7°F | Wt 179.2 lb

## 2016-05-01 DIAGNOSIS — R05 Cough: Secondary | ICD-10-CM

## 2016-05-01 DIAGNOSIS — R059 Cough, unspecified: Secondary | ICD-10-CM

## 2016-05-01 DIAGNOSIS — J209 Acute bronchitis, unspecified: Secondary | ICD-10-CM

## 2016-05-01 LAB — POCT INFLUENZA A/B
INFLUENZA A, POC: NEGATIVE
Influenza B, POC: NEGATIVE

## 2016-05-01 MED ORDER — PREDNISONE 20 MG PO TABS: 20.0000 mg | ORAL_TABLET | Freq: Every day | ORAL | 0 refills | Status: DC

## 2016-05-01 NOTE — Patient Instructions (Signed)

## 2016-05-01 NOTE — Progress Notes (Signed)
Subjective:     Patient ID: Mallory Hines, female   DOB: July 04, 1950, 65 y.o.   MRN: [id number]  HPI 65 year old WF, nonsmoker, is in today with c/o cough, sore throat chest congestion, x 2 days and worsening. Cough feels tight. Nonproductive. Low grade fever and chills. Taking [place] HBP. Reports grandson had bronchitis over the holidays.Reports symptoms worsened after going outside yesterday.   Review of Systems  Constitutional: Negative.   HENT: Positive for congestion, postnasal drip, rhinorrhea, sinus pressure, sneezing and sore throat.   Eyes: Negative.   Respiratory: Positive for cough and chest tightness. Negative for shortness of breath and wheezing.   Cardiovascular: Negative.  Negative for chest pain, palpitations and leg swelling.  Gastrointestinal: Negative.   Endocrine: Negative.   Musculoskeletal: Negative.   Skin: Negative.   Neurological: Negative.   Hematological: Negative.   Psychiatric/Behavioral: Negative.    Past Medical History:  Diagnosis Date  . Diverticulosis of colon (without mention of hemorrhage)   . Skin cancer [id number]   right calf, face  . Varicose veins     Social History   Social History  . Marital status: Married    Spouse name: N/A  . Number of children: 2  . Years of education: N/A   Occupational History  . housewife    Social History Main Topics  . Smoking status: Never Smoker  . Smokeless tobacco: Never Used  . Alcohol use 0.0 oz/week  . Drug use: No  . Sexual activity: Not on file   Other Topics Concern  . Not on file   Social History Narrative  . No narrative on file    Past Surgical History:  Procedure Laterality Date  . CESAREAN SECTION  1985  . COLONOSCOPY    . FLEXIBLE SIGMOIDOSCOPY    . FOOT SURGERY Right    3 rd toe    Family History  Problem Relation Age of Onset  . Colon cancer Neg Hx   . Pancreatic cancer Mother   . Heart attack Father 32  . Diabetes Sister     Allergies  Allergen Reactions  .  Celecoxib     REACTION: whelps  . Codeine     REACTION: does not like drug  . Meloxicam     REACTION: whelps  . Nsaids     REACTION: whelps    Current Outpatient Prescriptions on File Prior to Visit  Medication Sig Dispense Refill  . benazepril (LOTENSIN) 20 MG tablet TAKE 1 TABLET (20 MG TOTAL) BY MOUTH DAILY. 90 tablet 0  . ciprofloxacin (CIPRO) 250 MG tablet Take 1 tablet (250 mg total) by mouth 2 (two) times daily. (Patient not taking: Reported on 05/01/2016) 6 tablet 0  . ciprofloxacin (CIPRO) 250 MG tablet Take 1 tablet (250 mg total) by mouth every 12 (twelve) hours. (Patient not taking: Reported on 05/01/2016) 10 tablet 0  . fluconazole (DIFLUCAN) 150 MG tablet TAKE 1 TABLET (150 MG TOTAL) BY MOUTH ONCE. (Patient not taking: Reported on 05/01/2016) 1 tablet 1  . phenazopyridine (PYRIDIUM) 200 MG tablet Take 1 tablet (200 mg total) by mouth 3 (three) times daily. (Patient not taking: Reported on 05/01/2016) 6 tablet 0   No current facility-administered medications on file prior to visit.     BP (!) 160/98   Pulse 100   Temp 99.7 F (37.6 C) (Oral)   Wt 179 lb 3.2 oz (81.3 kg)   SpO2 99%   BMI 30.76 kg/m chart    Objective:  Physical Exam  Constitutional: She is oriented to person, place, and time. She appears well-developed and well-nourished.  HENT:  Right Ear: External ear normal.  Left Ear: External ear normal.  Nose: Nose normal.  Mouth/Throat: Oropharynx is clear and moist.  Neck: Normal range of motion. Neck supple.  Cardiovascular: Normal rate, regular rhythm and normal heart sounds.   Pulmonary/Chest: Effort normal and breath sounds normal. She has no wheezes.  Course breath sounds that clear with cough  Abdominal: Soft. Bowel sounds are normal.  Musculoskeletal: Normal range of motion.  Neurological: She is alert and oriented to person, place, and time.  Skin: Skin is warm and dry.  Psychiatric: She has a normal mood and affect.       Assessment:      1. Acute Bronchitis  2. Cough    Plan:     1. Prednisone 40 mg once daily x 5 days. Discussed possible side effects. Call with any questions or concerns. Recheck as needed.

## 2016-05-12 ENCOUNTER — Ambulatory Visit (INDEPENDENT_AMBULATORY_CARE_PROVIDER_SITE_OTHER): Payer: Self-pay | Admitting: Nurse Practitioner

## 2016-05-12 VITALS — BP 150/90 | HR 86 | Temp 98.1°F | Wt 179.0 lb

## 2016-05-12 DIAGNOSIS — J209 Acute bronchitis, unspecified: Secondary | ICD-10-CM

## 2016-05-12 MED ORDER — BENZONATATE 100 MG PO CAPS: 100.0000 mg | ORAL_CAPSULE | Freq: Two times a day (BID) | ORAL | 0 refills | Status: AC | PRN

## 2016-05-12 NOTE — Patient Instructions (Signed)

## 2016-05-12 NOTE — Progress Notes (Signed)
Subjective:     Mallory Hines is a 66 y.o. female who presents for evaluation of nonproductive cough. Symptoms began several days ago. Symptoms have been gradually improving since that time, but patient is concerned due to persistent cough.  The patient denies associated symptoms fever, congestion, runny nose, ear pain.  Patient does c/o chest soreness from coughing.  Patient was seen at [facility] and prescribed Prednisone, which she has completed.  Past history is significant for sinusitis.  Patient's past medical history and allergies were reviewed.   Review of Systems Pertinent items are noted in HPI.    Objective:    BP (!) 150/90   Pulse 86   Temp 98.1 F (36.7 C)   Wt 179 lb (81.2 kg)   SpO2 99%   BMI 30.73 kg/m  General appearance: alert, cooperative and no distress Head: Normocephalic, without obvious abnormality, atraumatic Eyes: conjunctivae/corneas clear. PERRL, EOM's intact. Fundi benign. Ears: normal TM's and external ear canals both ears Nose: Nares normal. Septum midline. Mucosa normal. No drainage or sinus tenderness. Throat: lips, mucosa, and tongue normal; teeth and gums normal Lungs: clear to auscultation bilaterally and coughing during exam. Heart: regular rate and rhythm, S1, S2 normal, no murmur, click, rub or gallop    Assessment:    Acute Bronchitis    Plan:    Worsening signs and symptoms discussed. Rest, fluids, acetaminophen, and humidification. Patient given prescription for Tessalon Perles.  Also instructed to use honey with lemon as needed.  Patient education provided.  Patient instructed that antibiotics are not needed at this time and cough could persist for 3-4 weeks.  Patient verbalized understanding. Follow up as needed for persistent, worsening cough, or appearance of new symptoms.

## 2016-07-08 ENCOUNTER — Encounter: Payer: Self-pay | Admitting: Nurse Practitioner

## 2016-07-08 ENCOUNTER — Ambulatory Visit (INDEPENDENT_AMBULATORY_CARE_PROVIDER_SITE_OTHER): Payer: Self-pay | Admitting: Nurse Practitioner

## 2016-07-08 ENCOUNTER — Emergency Department (HOSPITAL_COMMUNITY): Payer: 59

## 2016-07-08 ENCOUNTER — Emergency Department (HOSPITAL_COMMUNITY)
Admission: EM | Admit: 2016-07-08 | Discharge: 2016-07-08 | Disposition: A | Discharge status: Home / Self Care | Payer: 59 | Attending: Emergency Medicine | Admitting: Emergency Medicine

## 2016-07-08 ENCOUNTER — Encounter (HOSPITAL_COMMUNITY): Payer: Self-pay | Admitting: Emergency Medicine

## 2016-07-08 VITALS — BP 196/118 | HR 95 | Temp 98.7°F | Wt 187.0 lb

## 2016-07-08 DIAGNOSIS — T7840XA Allergy, unspecified, initial encounter: Secondary | ICD-10-CM

## 2016-07-08 DIAGNOSIS — Z85828 Personal history of other malignant neoplasm of skin: Secondary | ICD-10-CM | POA: Insufficient documentation

## 2016-07-08 DIAGNOSIS — R079 Chest pain, unspecified: Secondary | ICD-10-CM | POA: Insufficient documentation

## 2016-07-08 DIAGNOSIS — R030 Elevated blood-pressure reading, without diagnosis of hypertension: Secondary | ICD-10-CM

## 2016-07-08 DIAGNOSIS — I10 Essential (primary) hypertension: Secondary | ICD-10-CM | POA: Diagnosis not present

## 2016-07-08 LAB — CBC WITH DIFFERENTIAL/PLATELET
BASOS ABS: 0 10*3/uL (ref 0.0–0.1)
BASOS PCT: 0 %
EOS PCT: 2 %
Eosinophils Absolute: 0.1 10*3/uL (ref 0.0–0.7)
HEMATOCRIT: 40 % (ref 36.0–46.0)
Hemoglobin: 13.6 g/dL (ref 12.0–15.0)
Lymphocytes Relative: 27 %
Lymphs Abs: 1.5 10*3/uL (ref 0.7–4.0)
MCH: 29.4 pg (ref 26.0–34.0)
MCHC: 34 g/dL (ref 30.0–36.0)
MCV: 86.6 fL (ref 78.0–100.0)
MONOS PCT: 9 %
Monocytes Absolute: 0.5 10*3/uL (ref 0.1–1.0)
Neutro Abs: 3.4 10*3/uL (ref 1.7–7.7)
Neutrophils Relative %: 62 %
PLATELETS: 275 10*3/uL (ref 150–400)
RBC: 4.62 MIL/uL (ref 3.87–5.11)
RDW: 12.9 % (ref 11.5–15.5)
WBC: 5.5 10*3/uL (ref 4.0–10.5)

## 2016-07-08 LAB — I-STAT TROPONIN, ED: Troponin i, poc: 0.01 ng/mL (ref 0.00–0.08)

## 2016-07-08 LAB — BASIC METABOLIC PANEL
Anion gap: 10 (ref 5–15)
BUN: 17 mg/dL (ref 6–20)
CALCIUM: 9.5 mg/dL (ref 8.9–10.3)
CO2: 24 mmol/L (ref 22–32)
CREATININE: 0.66 mg/dL (ref 0.44–1.00)
Chloride: 103 mmol/L (ref 101–111)
GFR calc Af Amer: >60 mL/min (ref >60)
GLUCOSE: 106 mg/dL — AB (ref 65–99)
Potassium: 4.5 mmol/L (ref 3.5–5.1)
Sodium: 137 mmol/L (ref 135–145)

## 2016-07-08 LAB — URINALYSIS, ROUTINE W REFLEX MICROSCOPIC
Bilirubin Urine: NEGATIVE
Glucose, UA: NEGATIVE mg/dL
Hgb urine dipstick: NEGATIVE
KETONES UR: NEGATIVE mg/dL
Leukocytes, UA: NEGATIVE
Nitrite: NEGATIVE
PROTEIN: NEGATIVE mg/dL
Specific Gravity, Urine: 1.016 (ref 1.005–1.030)
pH: 5 (ref 5.0–8.0)

## 2016-07-08 MED ORDER — METHYLPREDNISOLONE ACETATE 80 MG/ML IJ SUSP
80.0000 mg | INTRAMUSCULAR | Status: AC
  Administered 2016-07-08: 80 mg via INTRAMUSCULAR

## 2016-07-08 NOTE — ED Triage Notes (Addendum)
Pt reports hypertension reading today at PCP when she was seen for other symptoms. Pt sts has not been on her BP meds regularly . Pt denies headache nor vision changes . No weakness. Alert and oriented x 4. deneis chest pain . sts took 20 mg Benazpril 20 min prior to arrival , 4 baby aspirin as well.

## 2016-07-08 NOTE — ED Notes (Signed)
ED Provider at bedside. 

## 2016-07-08 NOTE — Progress Notes (Deleted)
Patient presents with c/o possible allergic reaction.  Patient's bp was elevated during triage.  Repeated bp in both arms after waiting approximately 10 minutes, right arm 190/110; left arm 204/110.  Patient states she is on a bp med but did not take it today.  Patient started flucanazole last pm at 11pm and patient states she woke up with a rash to her bilateral UE and legs this morning.  Patient and states prior to her arrival in our office while at home she felt like her "heart was racing" and her "tongue was swelling".  Patient cannot recall anything that may have caused the onset.  Call to patient's PCP to see if she could be seen today.   Office scheduled patient for visit on 3/7 at 11am.

## 2016-07-08 NOTE — Discharge Instructions (Signed)

## 2016-07-08 NOTE — ED Provider Notes (Signed)
Emergency Department Provider Note   I have reviewed the triage vital signs and the nursing notes.   HISTORY  Chief Complaint Hypertension   HPI Mallory Hines is a 66 y.o. female with PMH of diverticulosis and HTN presents to the emergency department for evaluation of asymptomatic hypertension. Patient initially presented to an area urgent care with chest pain, itching, nausea in the setting of some emotional stress. They recorded vital signs which were abnormal. Specifically, her blood pressure was significantly elevated she was referred to the emergency department. She had almost immediate resolution of her chest pain upon arrival to the urgent care. She denies any associated headache, vision change, numbness/weakness, gait instability. Patient states that she is poorly compliant with her blood pressure medication. No symptoms at this time. Does not check BP regularly.    Past Medical History:  Diagnosis Date  . Diverticulosis of colon (without mention of hemorrhage)   . Skin cancer [id number]   right calf, face  . Varicose veins     Patient Active Problem List   Diagnosis Date Noted  . Spider veins of both lower extremities 03/20/2015  . Varicose veins of leg with complications [id number]  . Varicose veins of lower extremities with complications [id number]  . Sinusitis, acute 07/10/2014  . LLQ abdominal pain 07/12/2013  . Diverticulitis of colon (without mention of hemorrhage)(562.11) 07/12/2013  . ABDOMINAL PAIN, LEFT LOWER QUADRANT 01/16/2010  . Cystitis 07/18/2009  . VITAMIN D DEFICIENCY 02/17/2008  . HYPERTENSION 08/25/2007  . MELANOMA, LEG, HX OF 08/25/2007  . Unspecified personal history presenting hazards to health 08/25/2007    Past Surgical History:  Procedure Laterality Date  . CESAREAN SECTION  1985  . COLONOSCOPY    . FLEXIBLE SIGMOIDOSCOPY    . FOOT SURGERY Right    3 rd toe    Current Outpatient Rx  . Order #: [id number] Class: Normal  . Order #:  [id number] Class: Historical Med    Allergies Celecoxib; Codeine; Meloxicam; and Nsaids  Family History  Problem Relation Age of Onset  . Pancreatic cancer Mother   . Heart attack Father 17  . Diabetes Sister   . Colon cancer Neg Hx     Social History Social History  Substance Use Topics  . Smoking status: Never Smoker  . Smokeless tobacco: Never Used  . Alcohol use 0.0 oz/week    Review of Systems  Constitutional: No fever/chills Eyes: No visual changes. ENT: No sore throat. Cardiovascular: Positive chest pain. Positive asymptomatic HTN.  Respiratory: Denies shortness of breath. Gastrointestinal: No abdominal pain. No nausea, no vomiting.  No diarrhea.  No constipation. Genitourinary: Negative for dysuria. Musculoskeletal: Negative for back pain. Skin: Negative for rash. Neurological: Negative for headaches, focal weakness or numbness.  10-point ROS otherwise negative.  ____________________________________________   PHYSICAL EXAM:  VITAL SIGNS: ED Triage Vitals  Enc Vitals Group     BP 07/08/16 1647 (!) 224/122     Pulse Rate 07/08/16 1647 114     Resp 07/08/16 1647 18     Temp 07/08/16 1647 98.2 F (36.8 C)     Temp Source 07/08/16 1647 Oral     SpO2 07/08/16 1647 98 %     Pain Score 07/08/16 2014 0    Constitutional: Alert and oriented. Well appearing and in no acute distress. Eyes: Conjunctivae are normal. Head: Atraumatic. Nose: No congestion/rhinnorhea. Mouth/Throat: Mucous membranes are moist.  Oropharynx non-erythematous. Neck: No stridor.  Cardiovascular: Normal rate, regular rhythm. Good peripheral circulation. Grossly normal heart  sounds.   Respiratory: Normal respiratory effort.  No retractions. Lungs CTAB. Gastrointestinal: Soft and nontender. No distention.  Musculoskeletal: No lower extremity tenderness nor edema. No gross deformities of extremities. Neurologic:  Normal speech and language. No gross focal neurologic deficits are  appreciated. Normal gait.  Skin:  Skin is warm, dry and intact. No rash noted. Psychiatric: Mood and affect are normal. Speech and behavior are normal.  ____________________________________________   LABS (all labs ordered are listed, but only abnormal results are displayed)  Labs Reviewed  BASIC METABOLIC PANEL - Abnormal; Notable for the following:       Result Value   Glucose, Bld 106 (*)    All other components within normal limits  URINALYSIS, ROUTINE W REFLEX MICROSCOPIC - Abnormal; Notable for the following:    Bacteria, UA RARE (*)    Squamous Epithelial / LPF 0-5 (*)    All other components within normal limits  CBC WITH DIFFERENTIAL/PLATELET  I-STAT TROPOININ, ED   ____________________________________________  EKG   EKG Interpretation  Date/Time:  Tuesday July 08 2016 20:49:35 EST Ventricular Rate:  79 PR Interval:    QRS Duration: 97 QT Interval:  380 QTC Calculation: 436 R Axis:   -31 Text Interpretation:  Sinus rhythm Left axis deviation Abnormal R-wave progression, late transition No STEMI.  Confirmed by LONG MD, JOSHUA [id number]) on 07/08/2016 9:16:20 PM Also confirmed by LONG MD, JOSHUA [id number]), editor [place]  CCT, BEVERLY ([id number])  on 07/09/2016 6:57:14 AM       ____________________________________________  RADIOLOGY  Dg Chest 2 View  Result Date: 07/08/2016 CLINICAL DATA:  Chest pain, initial encounter EXAM: CHEST  2 VIEW COMPARISON:  None. FINDINGS: The heart size and mediastinal contours are within normal limits. Both lungs are clear. Degenerative changes along the dorsal spine. IMPRESSION: No active cardiopulmonary disease. Electronically Signed   By: Ashley Royalty M.D.   On: 07/08/2016 20:42    ____________________________________________   PROCEDURES  Procedure(s) performed:   Procedures  None ____________________________________________   INITIAL IMPRESSION / ASSESSMENT AND PLAN / ED COURSE  Pertinent labs & imaging results that were  available during my care of the patient were reviewed by me and considered in my medical decision making (see chart for details).  Patient resents to the emergency department for evaluation of a symptomatically hypertension. She had some chest discomfort earlier in the afternoon that resolved spontaneously. Labs including chest x-ray are unremarkable. No evidence of end organ damage secondary to elevated blood pressures. She plans to restart her blood pressure medication and take it regularly. She will call her primary care physician tomorrow to schedule an outpatient appointment to discuss her blood pressure and chest pain. EKG is normal. Very low suspicion for ACS in this case. BP is down-trending in the ED without acute intervention. Patient did take BP medication immediately prior to ED presentation. She has a PCP appointment scheduled for tomorrow at 11 AM.   At this time, I do not feel there is any life-threatening condition present. I have reviewed and discussed all results (EKG, imaging, lab, urine as appropriate), exam findings with patient. I have reviewed nursing notes and appropriate previous records.  I feel the patient is safe to be discharged home without further emergent workup. Discussed usual and customary return precautions. Patient and family (if present) verbalize understanding and are comfortable with this plan.  Patient will follow-up with their primary care provider. If they do not have a primary care provider, information for follow-up has been provided to  them. All questions have been answered.  ____________________________________________  FINAL CLINICAL IMPRESSION(S) / ED DIAGNOSES  Final diagnoses:  Hypertension, unspecified type  Chest pain, unspecified type     MEDICATIONS GIVEN DURING THIS VISIT:  None  NEW OUTPATIENT MEDICATIONS STARTED DURING THIS VISIT:  None   Note:  This document was prepared using Dragon voice recognition software and may include  unintentional dictation errors.  Nanda Quinton, MD Emergency Medicine   Margette Fast, MD 07/09/16 1058

## 2016-07-08 NOTE — Patient Instructions (Addendum)
Allergies, Adult An allergy is when your body's defense system (immune system) overreacts to an otherwise harmless substance (allergen) that you breathe in or eat or something that touches your skin. When you come into contact with something that you are allergic to, your immune system produces certain proteins (antibodies). These proteins cause cells to release chemicals (histamines) that trigger the symptoms of an allergic reaction. Allergies often affect the nasal passages (allergic rhinitis), eyes (allergic conjunctivitis), skin (atopic dermatitis), and stomach. Allergies can be mild or severe. Allergies cannot spread from person to person (are not contagious). They can develop at any age and may be outgrown. What are the causes? Allergies can be caused by any substance that your immune system mistakenly targets as harmful. These may include:  Outdoor allergens, such as pollen, grass, weeds, car exhaust, and mold spores.  Indoor allergens, such as dust, smoke, mold, and pet dander.  Foods, especially peanuts, milk, eggs, fish, shellfish, soy, nuts, and wheat.  Medicines, such as penicillin.  Skin irritants, such as detergents, chemicals, and latex.  Perfume.  Insect bites or stings. What increases the risk? You may be at greater risk of allergies if other people in your family have allergies. What are the signs or symptoms? Symptoms depend on what type of allergy you have. They may include:  Runny, stuffy nose.  Sneezing.  Itchy mouth, ears, or throat.  Postnasal drip.  Sore throat.  Itchy, red, watery, or puffy eyes.  Skin rash or hives.  Stomach pain.  Vomiting.  Diarrhea.  Bloating.  Wheezing or coughing. People with a severe allergy to food, medicine, or an insect bite may have a life-threatening allergic reaction (anaphylaxis). Symptoms of anaphylaxis include:  Hives.  Itching.  Flushed face.  Swollen lips, tongue, or mouth.  Tight or swollen  throat.  Chest pain or tightness in the chest.  Trouble breathing or shortness of breath.  Rapid heartbeat.  Dizziness or fainting.  Vomiting.  Diarrhea.  Pain in the abdomen. How is this diagnosed? This condition is diagnosed based on:  Your symptoms.  Your family and medical history.  A physical exam. You may need to see a health care provider who specializes in treating allergies (allergist). You may also have tests, including:  Skin tests to see which allergens are causing your symptoms, such as:  Skin prick test. In this test, your skin is pricked with a tiny needle and exposed to small amounts of possible allergens to see if your skin reacts.  Intradermal skin test. In this test, a small amount of allergen is injected under your skin to see if your skin reacts.  Patch test. In this test, a small amount of allergen is placed on your skin and then your skin is covered with a bandage. Your health care provider will check your skin after a couple of days to see if a rash has developed.  Blood tests.  Challenges tests. In this test, you inhale a small amount of allergen by mouth to see if you have an allergic reaction. You may also be asked to:  Keep a food diary. A food diary is a record of all the foods and drinks you have in a day and any symptoms you experience.  Practice an elimination diet. An elimination diet involves eliminating specific foods from your diet and then adding them back in one by one to find out if a certain food causes an allergic reaction. How is this treated? Treatment for allergies depends on your symptoms.   Treatment may include:  Cold compresses to soothe itching and swelling.  Eye drops.  Nasal sprays.  Using a saline spray or container (neti pot) to flush out the nose (nasal irrigation). These methods can help clear away mucus and keep the nasal passages moist.  Using a humidifier.  Oral antihistamines or other medicines to block  allergic reaction and inflammation.  Skin creams to treat rashes or itching.  Diet changes to eliminate food allergy triggers.  Repeated exposure to tiny amounts of allergens to build up a tolerance and prevent future allergic reactions (immunotherapy). These include:  Allergy shots.  Oral treatment. This involves taking small doses of an allergen under the tongue (sublingual immunotherapy).  Emergency epinephrine injection (auto-injector) in case of an allergic emergency. This is a self-injectable, pre-measured medicine that must be given within the first few minutes of a serious allergic reaction. Follow these instructions at home:  Avoid known allergens whenever possible.  If you suffer from airborne allergens, wash out your nose daily. You can do this with a saline spray or a neti pot to flush out your nose (nasal irrigation).  Take over-the-counter and prescription medicines only as told by your health care provider.  Keep all follow-up visits as told by your health care provider. This is important.  If you are at risk of a severe allergic reaction (anaphylaxis), keep your auto-injector with you at all times.  If you have ever had anaphylaxis, wear a medical alert bracelet or necklace that states you have a severe allergy. Contact a health care provider if:  Your symptoms do not improve with treatment. Get help right away if:  You have symptoms of anaphylaxis, such as:  Swollen mouth, tongue, or throat.  Pain or tightness in your chest.  Trouble breathing or shortness of breath.  Dizziness or fainting.  Severe abdominal pain, vomiting, or diarrhea. This information is not intended to replace advice given to you by your health care provider. Make sure you discuss any questions you have with your health care provider. Document Released: 07/15/2002 Document Revised: 12/20/2015 Document Reviewed: 11/07/2015 Elsevier Interactive Patient Education  2017 [employer].  Hypertension Hypertension, commonly called high blood pressure, is when the force of blood pumping through the arteries is too strong. The arteries are the blood vessels that carry blood from the heart throughout the body. Hypertension forces the heart to work harder to pump blood and may cause arteries to become narrow or stiff. Having untreated or uncontrolled hypertension can cause heart attacks, strokes, kidney disease, and other problems. A blood pressure reading consists of a higher number over a lower number. Ideally, your blood pressure should be below 120/80. The first ("top") number is called the systolic pressure. It is a measure of the pressure in your arteries as your heart beats. The second ("bottom") number is called the diastolic pressure. It is a measure of the pressure in your arteries as the heart relaxes.  PATIENT INSTRUCTED TO GO DIRECTLY TO EMERGENCY ROOM.   What are the causes? The cause of this condition is not known. What increases the risk? Some risk factors for high blood pressure are under your control. Others are not. Factors you can change   Smoking.  Having type 2 diabetes mellitus, high cholesterol, or both.  Not getting enough exercise or physical activity.  Being overweight.  Having too much fat, sugar, calories, or salt (sodium) in your diet.  Drinking too much alcohol. Factors that are difficult or impossible to change  Having chronic kidney disease.  Having a family history of high blood pressure.  Age. Risk increases with age.  Race. You may be at higher risk if you are African-American.  Gender. Men are at higher risk than women before age 28. After age 51, women are at higher risk than men.  Having obstructive sleep apnea.  Stress. What are the signs or symptoms? Extremely high blood pressure (hypertensive crisis) may cause:  Headache.  Anxiety.  Shortness of breath.  Nosebleed.  Nausea and vomiting.  Severe chest  pain.  Jerky movements you cannot control (seizures). How is this diagnosed? This condition is diagnosed by measuring your blood pressure while you are seated, with your arm resting on a surface. The cuff of the blood pressure monitor will be placed directly against the skin of your upper arm at the level of your heart. It should be measured at least twice using the same arm. Certain conditions can cause a difference in blood pressure between your right and left arms. Certain factors can cause blood pressure readings to be lower or higher than normal (elevated) for a short period of time:  When your blood pressure is higher when you are in a health care provider's office than when you are at home, this is called white coat hypertension. Most people with this condition do not need medicines.  When your blood pressure is higher at home than when you are in a health care provider's office, this is called masked hypertension. Most people with this condition may need medicines to control blood pressure. If you have a high blood pressure reading during one visit or you have normal blood pressure with other risk factors:  You may be asked to return on a different day to have your blood pressure checked again.  You may be asked to monitor your blood pressure at home for 1 week or longer. If you are diagnosed with hypertension, you may have other blood or imaging tests to help your health care provider understand your overall risk for other conditions. How is this treated? This condition is treated by making healthy lifestyle changes, such as eating healthy foods, exercising more, and reducing your alcohol intake. Your health care provider may prescribe medicine if lifestyle changes are not enough to get your blood pressure under control, and if:  Your systolic blood pressure is above 130.  Your diastolic blood pressure is above 80. Your personal target blood pressure may vary depending on your medical  conditions, your age, and other factors. Follow these instructions at home: Eating and drinking   Eat a diet that is high in fiber and potassium, and low in sodium, added sugar, and fat. An example eating plan is called the DASH (Dietary Approaches to Stop Hypertension) diet. To eat this way:  Eat plenty of fresh fruits and vegetables. Try to fill half of your plate at each meal with fruits and vegetables.  Eat whole grains, such as whole wheat pasta, brown rice, or whole grain bread. Fill about one quarter of your plate with whole grains.  Eat or drink low-fat dairy products, such as skim milk or low-fat yogurt.  Avoid fatty cuts of meat, processed or cured meats, and poultry with skin. Fill about one quarter of your plate with lean proteins, such as fish, chicken without skin, beans, eggs, and tofu.  Avoid premade and processed foods. These tend to be higher in sodium, added sugar, and fat.  Reduce your daily sodium intake. Most people with hypertension  should eat less than 1,500 mg of sodium a day.  Limit alcohol intake to no more than 1 drink a day for nonpregnant women and 2 drinks a day for men. One drink equals 12 oz of beer, 5 oz of wine, or 1 oz of hard liquor. Lifestyle   Work with your health care provider to maintain a healthy body weight or to lose weight. Ask what an ideal weight is for you.  Get at least 30 minutes of exercise that causes your heart to beat faster (aerobic exercise) most days of the week. Activities may include walking, swimming, or biking.  Include exercise to strengthen your muscles (resistance exercise), such as pilates or lifting weights, as part of your weekly exercise routine. Try to do these types of exercises for 30 minutes at least 3 days a week.  Do not use any products that contain nicotine or tobacco, such as cigarettes and e-cigarettes. If you need help quitting, ask your health care provider.  Monitor your blood pressure at home as told by  your health care provider.  Keep all follow-up visits as told by your health care provider. This is important. Medicines   Take over-the-counter and prescription medicines only as told by your health care provider. Follow directions carefully. Blood pressure medicines must be taken as prescribed.  Do not skip doses of blood pressure medicine. Doing this puts you at risk for problems and can make the medicine less effective.  Ask your health care provider about side effects or reactions to medicines that you should watch for. Contact a health care provider if:  You think you are having a reaction to a medicine you are taking.  You have headaches that keep coming back (recurring).  You feel dizzy.  You have swelling in your ankles.  You have trouble with your vision. Get help right away if:  You develop a severe headache or confusion.  You have unusual weakness or numbness.  You feel faint.  You have severe pain in your chest or abdomen.  You vomit repeatedly.  You have trouble breathing. Summary  Hypertension is when the force of blood pumping through your arteries is too strong. If this condition is not controlled, it may put you at risk for serious complications.  Your personal target blood pressure may vary depending on your medical conditions, your age, and other factors. For most people, a normal blood pressure is less than 120/80.  Hypertension is treated with lifestyle changes, medicines, or a combination of both. Lifestyle changes include weight loss, eating a healthy, low-sodium diet, exercising more, and limiting alcohol. This information is not intended to replace advice given to you by your health care provider. Make sure you discuss any questions you have with your health care provider. Document Released: 04/21/2005 Document Revised: 03/19/2016 Document Reviewed: 03/19/2016 Elsevier Interactive Patient Education  2017 [employer].

## 2016-07-08 NOTE — Progress Notes (Addendum)
   Subjective:    Patient ID: Mallory Hines, female    DOB: [birth year], 66 y.o.   MRN: [id number]  Patient presents with c/o possible allergic reaction.  Patient's bp was elevated during triage.  Repeated bp in both arms after waiting approximately 10 minutes, right arm 190/110; left arm 204/110.  Patient states she is on a bp med but did not take it today.  Patient started flucanazole last pm at 11pm and patient states she woke up with a rash to her bilateral UE and legs this morning.  Patient states prior to her arrival in our office while at home she felt like her "heart was racing" and her "tongue was swelling".  Patient cannot recall anything that may have caused the onset.  Call to patient's PCP to see if she could be seen today.   Office scheduled patient for visit on 3/7 at 11am.   Patient c/o itching, denies SOB at present.  Patient states, "I think I was anxious".  Patient states she did not take her bp medicine today and did not have it with her.        Review of Systems  Constitutional: Negative.   HENT: Negative.   Eyes: Negative.   Respiratory: Negative.   Cardiovascular: Positive for palpitations. Negative for chest pain.       HR- 94; c/o palpitations prior to arrival   Skin:       Flushing to face  Allergic/Immunologic:       C/o itching to bilateral upper extremities and bilateral lower extremities. + flushing to face  Neurological: Negative.   Psychiatric/Behavioral: The patient is nervous/anxious.        Objective:   Physical Exam  Constitutional: She is oriented to person, place, and time. She appears well-developed and well-nourished.  HENT:  Head: Normocephalic and atraumatic.  Neck: Normal range of motion. Neck supple. No thyromegaly present.  Cardiovascular: Normal rate, regular rhythm, normal heart sounds and intact distal pulses.   No EKG machine in our office  Pulmonary/Chest: Effort normal and breath sounds normal.  Abdominal: Soft. Bowel sounds are  normal. She exhibits no distension.  Neurological: She is alert and oriented to person, place, and time. She has normal reflexes.  Skin: Skin is warm and dry.  Psychiatric: Judgment and thought content normal.  Anxious, flight of thoughts          Assessment & Plan:  Patient instructed to go directly to ER d/t no change in bp readings bilaterally.  Patient refused transport to ER via EMS.  Patient's bp rechecked prior to being sent to ER, remain elevated (see vital signs).

## 2016-07-09 ENCOUNTER — Ambulatory Visit (INDEPENDENT_AMBULATORY_CARE_PROVIDER_SITE_OTHER): Payer: 59 | Admitting: Family

## 2016-07-09 ENCOUNTER — Encounter: Payer: Self-pay | Admitting: Family

## 2016-07-09 VITALS — BP 162/100 | HR 63 | Temp 98.3°F | Resp 16 | Ht 64.0 in | Wt 186.0 lb

## 2016-07-09 DIAGNOSIS — I10 Essential (primary) hypertension: Secondary | ICD-10-CM | POA: Diagnosis not present

## 2016-07-09 MED ORDER — AMLODIPINE BESYLATE 10 MG PO TABS: 10.0000 mg | ORAL_TABLET | Freq: Every day | ORAL | 1 refills | Status: DC

## 2016-07-09 MED ORDER — BENAZEPRIL HCL 20 MG PO TABS: ORAL_TABLET | ORAL | 0 refills | Status: DC

## 2016-07-09 NOTE — Progress Notes (Signed)
Subjective:    Patient ID: Mallory Hines, female    DOB: 1950-07-11, 66 y.o.   MRN: [id number]  Chief Complaint  Patient presents with  . Hypertension    follow up on blood pressure    HPI:  Mallory Hines is a 66 y.o. female who  has a past medical history of Diverticulosis of colon (without mention of hemorrhage); Skin cancer ([phone]); and Varicose veins. and presents today for a follow up office visit.  Hypertension - Currently prescribed benazepril. Was seen yesterday at [facility] and was noted to have elevated blood pressure reading of 196/118 with patient indicating that she had not taken her blood pressure medication. She was referred to ED with some chest discomfort that resolved. Labs and x-rays at the time were unremarkable. There was low suspicion for ACS. Previous history of medication refills is consistent with non-compliance with medication regimen. Since leaving the ED she reports that her blood pressure has come down. Has restarted taking her blood pressure medications within the last 24 hours. Denies worst headache of life with no new symptoms of end organ damage. Not following a low sodium diet.    Allergies  Allergen Reactions  . Celecoxib     REACTION: whelps  . Codeine     REACTION: does not like drug  . Meloxicam     REACTION: whelps  . Nsaids     REACTION: whelps      Outpatient Medications Prior to Visit  Medication Sig Dispense Refill  . diphenhydrAMINE (BENADRYL) 50 MG capsule Take 50 mg by mouth every 6 (six) hours as needed for allergies.    . benazepril (LOTENSIN) 20 MG tablet TAKE 1 TABLET (20 MG TOTAL) BY MOUTH DAILY. 90 tablet 0   No facility-administered medications prior to visit.      Review of Systems  Constitutional: Negative for chills and fever.  Eyes:       Negative for changes in vision  Respiratory: Negative for cough, chest tightness and wheezing.   Cardiovascular: Negative for chest pain, palpitations and leg swelling.    Neurological: Negative for dizziness, weakness and light-headedness.      Objective:    BP (!) 162/100 (BP Location: Left Arm, Patient Position: Sitting, Cuff Size: Large)   Pulse 63   Temp 98.3 F (36.8 C) (Oral)   Resp 16   Ht 5\' 4"  (1.626 m)   Wt 186 lb (84.4 kg)   SpO2 97%   BMI 31.93 kg/m  Nursing note and vital signs reviewed.  Physical Exam  Constitutional: She is oriented to person, place, and time. She appears well-developed and well-nourished. No distress.  Cardiovascular: Normal rate, regular rhythm, normal heart sounds and intact distal pulses.   Pulmonary/Chest: Effort normal and breath sounds normal.  Neurological: She is alert and oriented to person, place, and time.  Skin: Skin is warm and dry.  Psychiatric: She has a normal mood and affect. Her behavior is normal. Judgment and thought content normal.       Assessment & Plan:   Problem List Items Addressed This Visit      Cardiovascular and Mediastinum   Essential hypertension - Primary    Blood pressure with improvement since restarting medication secondary to noncompliance with medication regimen. Start amlodipine and continue current dosage of benazepril. Denies worst headache of life with no new symptoms of end organ damage noted on physical exam. Patient educated regarding importance of taking her medications as prescribed to reduce risk for end  organ damage in the future. Information on Dash eating plan provided. Follow-up in 2 weeks or sooner if needed.      Relevant Medications   amLODipine (NORVASC) 10 MG tablet   benazepril (LOTENSIN) 20 MG tablet       I am having Mallory Hines start on amLODipine. I am also having her maintain her diphenhydrAMINE and benazepril.   Meds ordered this encounter  Medications  . amLODipine (NORVASC) 10 MG tablet    Sig: Take 1 tablet (10 mg total) by mouth daily.    Dispense:  30 tablet    Refill:  1    Order Specific Question:   Supervising Provider     Answer:   Pricilla Holm A [[id number]]  . benazepril (LOTENSIN) 20 MG tablet    Sig: TAKE 1 TABLET (20 MG TOTAL) BY MOUTH DAILY.    Dispense:  30 tablet    Refill:  0    Order Specific Question:   Supervising Provider    Answer:   Pricilla Holm A [[id number]]     Follow-up: Return in about 2 weeks (around 07/23/2016), or if symptoms worsen or fail to improve.  Mauricio Po, FNP

## 2016-07-09 NOTE — Patient Instructions (Signed)
Thank you for choosing [facility].  SUMMARY AND INSTRUCTIONS:  Please take the benazepril and amlodipine.   Monitor your blood pressure at home.   Follow a low sodium diet.   Medication:  Your prescription(s) have been submitted to your pharmacy or been printed and provided for you. Please take as directed and contact our office if you believe you are having problem(s) with the medication(s) or have any questions.  Follow up:  If your symptoms worsen or fail to improve, please contact our office for further instruction, or in case of emergency go directly to the emergency room at the closest medical facility.    DASH Eating Plan DASH stands for "Dietary Approaches to Stop Hypertension." The DASH eating plan is a healthy eating plan that has been shown to reduce high blood pressure (hypertension). It may also reduce your risk for type 2 diabetes, heart disease, and stroke. The DASH eating plan may also help with weight loss. What are tips for following this plan? General guidelines   Avoid eating more than 2,300 mg (milligrams) of salt (sodium) a day. If you have hypertension, you may need to reduce your sodium intake to 1,500 mg a day.  Limit alcohol intake to no more than 1 drink a day for nonpregnant women and 2 drinks a day for men. One drink equals 12 oz of beer, 5 oz of wine, or 1 oz of hard liquor.  Work with your health care provider to maintain a healthy body weight or to lose weight. Ask what an ideal weight is for you.  Get at least 30 minutes of exercise that causes your heart to beat faster (aerobic exercise) most days of the week. Activities may include walking, swimming, or biking.  Work with your health care provider or diet and nutrition specialist (dietitian) to adjust your eating plan to your individual calorie needs. Reading food labels   Check food labels for the amount of sodium per serving. Choose foods with less than 5 percent of the Daily Value of  sodium. Generally, foods with less than 300 mg of sodium per serving fit into this eating plan.  To find whole grains, look for the word "whole" as the first word in the ingredient list. Shopping   Buy products labeled as "low-sodium" or "no salt added."  Buy fresh foods. Avoid canned foods and premade or frozen meals. Cooking   Avoid adding salt when cooking. Use salt-free seasonings or herbs instead of table salt or sea salt. Check with your health care provider or pharmacist before using salt substitutes.  Do not fry foods. Cook foods using healthy methods such as baking, boiling, grilling, and broiling instead.  Cook with heart-healthy oils, such as olive, canola, soybean, or sunflower oil. Meal planning    Eat a balanced diet that includes:  5 or more servings of fruits and vegetables each day. At each meal, try to fill half of your plate with fruits and vegetables.  Up to 6-8 servings of whole grains each day.  Less than 6 oz of lean meat, poultry, or fish each day. A 3-oz serving of meat is about the same size as a deck of cards. One egg equals 1 oz.  2 servings of low-fat dairy each day.  A serving of nuts, seeds, or beans 5 times each week.  Heart-healthy fats. Healthy fats called Omega-3 fatty acids are found in foods such as flaxseeds and coldwater fish, like sardines, salmon, and mackerel.  Limit how much you eat  of the following:  Canned or prepackaged foods.  Food that is high in trans fat, such as fried foods.  Food that is high in saturated fat, such as fatty meat.  Sweets, desserts, sugary drinks, and other foods with added sugar.  Full-fat dairy products.  Do not salt foods before eating.  Try to eat at least 2 vegetarian meals each week.  Eat more home-cooked food and less restaurant, buffet, and fast food.  When eating at a restaurant, ask that your food be prepared with less salt or no salt, if possible. What foods are recommended? The items  listed may not be a complete list. Talk with your dietitian about what dietary choices are best for you. Grains  Whole-grain or whole-wheat bread. Whole-grain or whole-wheat pasta. Brown rice. Modena Morrow. Bulgur. Whole-grain and low-sodium cereals. Pita bread. Low-fat, low-sodium crackers. Whole-wheat flour tortillas. Vegetables  Fresh or frozen vegetables (raw, steamed, roasted, or grilled). Low-sodium or reduced-sodium tomato and vegetable juice. Low-sodium or reduced-sodium tomato sauce and tomato paste. Low-sodium or reduced-sodium canned vegetables. Fruits  All fresh, dried, or frozen fruit. Canned fruit in natural juice (without added sugar). Meat and other protein foods  Skinless chicken or [place]. Ground chicken or [place]. Pork with fat trimmed off. Fish and seafood. Egg whites. Dried beans, peas, or lentils. Unsalted nuts, nut butters, and seeds. Unsalted canned beans. Lean cuts of beef with fat trimmed off. Low-sodium, lean deli meat. Dairy  Low-fat (1%) or fat-free (skim) milk. Fat-free, low-fat, or reduced-fat cheeses. Nonfat, low-sodium ricotta or cottage cheese. Low-fat or nonfat yogurt. Low-fat, low-sodium cheese. Fats and oils  Soft margarine without trans fats. Vegetable oil. Low-fat, reduced-fat, or light mayonnaise and salad dressings (reduced-sodium). Canola, safflower, olive, soybean, and sunflower oils. Avocado. Seasoning and other foods  Herbs. Spices. Seasoning mixes without salt. Unsalted popcorn and pretzels. Fat-free sweets. What foods are not recommended? The items listed may not be a complete list. Talk with your dietitian about what dietary choices are best for you. Grains  Baked goods made with fat, such as croissants, muffins, or some breads. Dry pasta or rice meal packs. Vegetables  Creamed or fried vegetables. Vegetables in a cheese sauce. Regular canned vegetables (not low-sodium or reduced-sodium). Regular canned tomato sauce and paste (not low-sodium or  reduced-sodium). Regular tomato and vegetable juice (not low-sodium or reduced-sodium). Angie Fava. Olives. Fruits  Canned fruit in a light or heavy syrup. Fried fruit. Fruit in cream or butter sauce. Meat and other protein foods  Fatty cuts of meat. Ribs. Fried meat. Berniece Salines. Sausage. Bologna and other processed lunch meats. Salami. Fatback. Hotdogs. Bratwurst. Salted nuts and seeds. Canned beans with added salt. Canned or smoked fish. Whole eggs or egg yolks. Chicken or [place] with skin. Dairy  Whole or 2% milk, cream, and half-and-half. Whole or full-fat cream cheese. Whole-fat or sweetened yogurt. Full-fat cheese. Nondairy creamers. Whipped toppings. Processed cheese and cheese spreads. Fats and oils  Butter. Stick margarine. Lard. Shortening. Ghee. Bacon fat. Tropical oils, such as coconut, palm kernel, or palm oil. Seasoning and other foods  Salted popcorn and pretzels. Onion salt, garlic salt, seasoned salt, table salt, and sea salt. Worcestershire sauce. Tartar sauce. Barbecue sauce. Teriyaki sauce. Soy sauce, including reduced-sodium. Steak sauce. Canned and packaged gravies. Fish sauce. Oyster sauce. Cocktail sauce. Horseradish that you find on the shelf. Ketchup. Mustard. Meat flavorings and tenderizers. Bouillon cubes. Hot sauce and Tabasco sauce. Premade or packaged marinades. Premade or packaged taco seasonings. Relishes. Regular salad dressings. Where to  find more information:  National Heart, Lung, and Blood Institute: https://wilson-eaton.com/  American Heart Association: www.heart.org Summary  The DASH eating plan is a healthy eating plan that has been shown to reduce high blood pressure (hypertension). It may also reduce your risk for type 2 diabetes, heart disease, and stroke.  With the DASH eating plan, you should limit salt (sodium) intake to 2,300 mg a day. If you have hypertension, you may need to reduce your sodium intake to 1,500 mg a day.  When on the DASH eating plan, aim to eat  more fresh fruits and vegetables, whole grains, lean proteins, low-fat dairy, and heart-healthy fats.  Work with your health care provider or diet and nutrition specialist (dietitian) to adjust your eating plan to your individual calorie needs. This information is not intended to replace advice given to you by your health care provider. Make sure you discuss any questions you have with your health care provider. Document Released: 04/10/2011 Document Revised: 04/14/2016 Document Reviewed: 04/14/2016 Elsevier Interactive Patient Education  2017 [employer].

## 2016-07-09 NOTE — Assessment & Plan Note (Signed)
Blood pressure with improvement since restarting medication secondary to noncompliance with medication regimen. Start amlodipine and continue current dosage of benazepril. Denies worst headache of life with no new symptoms of end organ damage noted on physical exam. Patient educated regarding importance of taking her medications as prescribed to reduce risk for end organ damage in the future. Information on Dash eating plan provided. Follow-up in 2 weeks or sooner if needed.

## 2016-07-10 ENCOUNTER — Telehealth: Payer: Self-pay | Admitting: Nurse Practitioner

## 2016-07-10 MED ORDER — PREDNISONE 10 MG (21) PO TBPK: ORAL_TABLET | ORAL | 0 refills | Status: AC

## 2016-07-10 NOTE — Telephone Encounter (Signed)
Patient called back.  Patient states she went to the ER and to see her PCP the following day.  The patient states her EKG, labwork and chest x-ray were normal.  Patient states she stayed in the ER for monitoring.  Patient went to see her PCP on yesterday and a new BP medication was added.  Patient states they stressed the importance of taking her BP meds and some of the associated side effects.  Patient states she will follow up in 2 weeks with her PCP.    Patient did inform that she is still itching.  Patient has been taking Benadryl with minimal relief.  Informed patient I will send her a prescription to help with her symptoms.

## 2016-07-10 NOTE — Telephone Encounter (Signed)
Called patient to follow up on her status.  Reached voicemail, left message for her to return my call.

## 2016-07-23 ENCOUNTER — Ambulatory Visit: Payer: 59 | Admitting: Family

## 2016-08-04 ENCOUNTER — Other Ambulatory Visit: Payer: Self-pay | Admitting: Family

## 2016-08-08 ENCOUNTER — Ambulatory Visit: Payer: 59 | Admitting: Family

## 2016-08-12 ENCOUNTER — Telehealth: Payer: Self-pay | Admitting: Family

## 2016-08-12 NOTE — Telephone Encounter (Signed)
The pt called today to cancel her appointment that she had scheduled for tomorrow to discuss her high bp and an allergic reaction. She said that she went to [facility] yesterday and was diagnoses with an autoimmune disorder. She said that her histamine is reaching high levels that her body can not fight. She is working with them to handle this issue. She also said that she is going to go to her dermatologist for the skin issues that she is having. She said that while she was there yesterday her BP was 120/70 which is much improved from what is was. She wanted to know if any changes need to be made to her medications that she is on (amlodipine and benazepril). She said that the changes/additions that were made were only suppose to be until she got her BP under control so she did not know if they needed to be adjusted at this time. Please advise.

## 2016-08-12 NOTE — Telephone Encounter (Signed)
Please have her monitor her blood pressure at home and provide the numbers. Continue to take medications as prescribed right now.

## 2016-08-12 NOTE — Telephone Encounter (Signed)
Pt aware of the note below.

## 2016-08-12 NOTE — Telephone Encounter (Signed)
Please advise 

## 2016-08-13 ENCOUNTER — Ambulatory Visit: Payer: 59 | Admitting: Family

## 2016-08-28 ENCOUNTER — Telehealth: Payer: Self-pay | Admitting: Family

## 2016-08-28 NOTE — Telephone Encounter (Signed)
Blood pressure is good. Recommend stopping medication to see if cramps go away. If they do then we may need a different medication. Continue to monitor blood pressure.

## 2016-08-28 NOTE — Telephone Encounter (Signed)
Pt called and stated the new BP meds amLODipine (NORVASC) 10 MG may be giving her leg cramps at night , she has been having them for 2-3 weeks.   Resting BP on Monday was 120/80.

## 2016-08-29 NOTE — Telephone Encounter (Signed)
LVM letting pt know.  

## 2016-09-03 ENCOUNTER — Other Ambulatory Visit: Payer: Self-pay | Admitting: Family

## 2016-09-04 ENCOUNTER — Other Ambulatory Visit: Payer: Self-pay | Admitting: Family

## 2016-09-07 ENCOUNTER — Other Ambulatory Visit: Payer: Self-pay | Admitting: Family

## 2016-11-25 ENCOUNTER — Other Ambulatory Visit: Payer: Self-pay | Admitting: Family

## 2016-12-28 ENCOUNTER — Other Ambulatory Visit: Payer: Self-pay | Admitting: Family

## 2017-01-05 ENCOUNTER — Ambulatory Visit: Payer: Self-pay | Admitting: Orthopedic Surgery

## 2017-01-05 NOTE — H&P (Signed)
Mallory Hines DOB: [birth year] Married / Language: English / Race: White Female Date of Admission:  01/19/2017 CC:  Right knee pain History of Present Illness  The patient is a 66 year old female who comes in for a preoperative History and Physical. The patient is scheduled for a right total knee arthroplasty to be performed by Dr. Dione Plover. Aluisio, MD at [facility] on 01/19/2017. The patient is a 66 year old female who presented for follow up of their knee. The patient is being followed for their right knee pain and osteoarthritis. They are now months out from [place] injection. Symptoms reported include: pain and swelling. The patient feels that they are doing well and report their pain level to be mild. The following medication has been used for pain control: none. The patient has reported improvement of their symptoms with: viscosupplementation. Patient had a sudden pain posteriorly with tenderness a couple weeks ago, she was seen at the [facility] clinic and had a Doppler which was negative for DVT. She has now improved after taking a Prednisone dose pack. She is now improved from the recent pain and now ready to proceed with the surgery of the right knee. They have been treated conservatively in the past for the above stated problem and despite conservative measures, they continue to have progressive pain and severe functional limitations and dysfunction. They have failed non-operative management including home exercise, medications, and injections. It is felt that they would benefit from undergoing total joint replacement. Risks and benefits of the procedure have been discussed with the patient and they elect to proceed with surgery. There are no active contraindications to surgery such as ongoing infection or rapidly progressive neurological disease.  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 Mobic *ANALGESICS - ANTI-INFLAMMATORY*  Aleve *ANALGESICS - ANTI-INFLAMMATORY*  CeleBREX *ANALGESICS - ANTI-INFLAMMATORY*  Please note that the patient has been able to take Ibuprofen without difficulty.  Family History Cancer  Mother. mother Diabetes Mellitus  Sister. sister Heart Disease  Father. father Hypertension  Sister. sister  Social History  Children  2 Current drinker  10/03/2013: Currently drinks wine only occasionally per week Current work status  unemployed Alcohol use  current drinker; drinks wine; only occasionally per week Drug/Alcohol Rehab (Currently)  no Exercise  Exercises daily; does running / walking and individual sport Exercises weekly; does running / walking Living situation  live with spouse Marital status  married Drug/Alcohol Rehab (Previously)  no Most recent primary occupation  Homemaker No history of drug/alcohol rehab  Not under pain contract  Number of flights of stairs before winded  greater than 5 Pain Contract  no Illicit drug use  no Tobacco use  Never smoker. 10/03/2013 never smoker Tobacco / smoke exposure  no Post-Surgical Plans  Home With Family. Advance Directives  Living Will, Healthcare Power of [place].  Medication History  Benazepril HCl (20MG  Tablet, Oral) Active.   Past Surgical History Cesarean Delivery  1 time   Review of Systems General Not Present- Chills, Fatigue, Fever, Memory Loss, Night  Sweats, Weight Gain and Weight Loss. Skin Present-  Hives and Itching. Not Present- Eczema, Lesions and Rash. HEENT Not Present- Dentures, Double Vision, Headache, Hearing Loss, Tinnitus and Visual Loss. Respiratory Not Present- Allergies, Chronic Cough, Coughing up blood, Shortness of breath at rest and Shortness of breath with exertion. Cardiovascular Not Present- Chest Pain, Difficulty Breathing Lying Down, Murmur, Palpitations, Racing/skipping heartbeats and Swelling. Gastrointestinal Not Present- Abdominal Pain, Bloody Stool, Constipation, Diarrhea, Difficulty Swallowing, Heartburn, Jaundice, Loss of appetitie, Nausea and Vomiting. Female Genitourinary Not Present- Blood in Urine, Discharge, Flank Pain, Incontinence, Painful Urination, Urgency, Urinary frequency, Urinary Retention, Urinating at Night and Weak urinary stream. Musculoskeletal Present- Joint Pain. Not Present- Back Pain, Joint Swelling, Morning Stiffness, Muscle Pain, Muscle Weakness and Spasms. Neurological Not Present- Blackout spells, Difficulty with balance, Dizziness, Paralysis, Tremor and Weakness. Psychiatric Not Present- Insomnia.  Vitals Weight: 180 lb Height: 64in Body Surface Area: 1.87 m Body Mass Index: 30.9 kg/m  Pulse: 72 (Regular)  Resp.: 16 (Unlabored)  BP: 148/88 (Sitting, Right Arm, Standard)       Physical Exam  General Mental Status -Alert, cooperative and good historian. General Appearance-pleasant, Not in acute distress. Orientation-Oriented X3. Build & Nutrition-Well nourished and Well developed.  Head and Neck Head-normocephalic, atraumatic . Neck Global Assessment - supple, no bruit auscultated on the right, no bruit auscultated on the left.  Eye Vision-Wears corrective lenses(readers only). Pupil - Bilateral-Regular and Round. Motion - Bilateral-EOMI.  Chest and Lung Exam Auscultation Breath sounds - clear at anterior chest wall and clear at posterior chest wall. Adventitious sounds - No Adventitious  sounds.  Cardiovascular Auscultation Rhythm - Regular rate and rhythm. Heart Sounds - S1 WNL and S2 WNL. Murmurs & Other Heart Sounds - Auscultation of the heart reveals - No Murmurs.  Abdomen Palpation/Percussion Tenderness - Abdomen is non-tender to palpation. Rigidity (guarding) - Abdomen is soft. Auscultation Auscultation of the abdomen reveals - Bowel sounds normal.  Female Genitourinary Note: Not done, not pertinent to present illness   Musculoskeletal Note: Well-developed female alert and oriented in no apparent distress. RIGHT knee shows no effusion. There is marked crepitus on range of motion of the RIGHT knee. Range is approximate 5-120. There is no instability.   Assessment & Plan Primary osteoarthritis of right knee (M17.11) Current Plans Right Total Knee Replacement  Disposition: Home - Straight to outpatient therapy. Prescription for Physical Therapy given. Patient instructed to schedule therapy either through [facility] or their preferred physical therapy provider.  PCP: Dr. Daneil Dolin - pending  IV TXA  Anesthesia Issues: None  Patient was instructed on what medications to stop prior to surgery.  Signed electronically by Joelene Millin, III PA-C

## 2017-01-07 DIAGNOSIS — R7309 Other abnormal glucose: Secondary | ICD-10-CM | POA: Insufficient documentation

## 2017-01-07 DIAGNOSIS — L503 Dermatographic urticaria: Secondary | ICD-10-CM | POA: Insufficient documentation

## 2017-01-07 DIAGNOSIS — E785 Hyperlipidemia, unspecified: Secondary | ICD-10-CM | POA: Insufficient documentation

## 2017-01-08 ENCOUNTER — Ambulatory Visit: Payer: Self-pay | Admitting: Orthopedic Surgery

## 2017-01-08 NOTE — Progress Notes (Signed)
Please place orders in EPIC as patient has a pre-op appointment on 01/12/2017! Thank you!

## 2017-01-09 NOTE — Progress Notes (Signed)
07-08-16 (EPIC) EKG, CXR

## 2017-01-09 NOTE — Patient Instructions (Signed)
Mallory Hines  01/09/2017   Your procedure is scheduled on: 01-19-17  Report to [facility] Main  Entrance Report to Admitting at 6:00 AM   Call this number if you have problems the morning of surgery  [phone]   Remember: ONLY 1 PERSON MAY GO WITH YOU TO SHORT STAY TO GET  READY MORNING OF YOUR SURGERY.  Do not eat food or drink liquids :After Midnight.     Take these medicines the morning of surgery with A SIP OF WATER: None                                You may not have any metal on your body including hair pins and              piercings  Do not wear jewelry, make-up, lotions, powders or perfumes, deodorant             Do not wear nail polish.  Do not shave  48 hours prior to surgery.              Do not bring valuables to the hospital. [place].  Contacts, dentures or bridgework may not be worn into surgery.  Leave suitcase in the car. After surgery it may be brought to your room.                 Please read over the following fact sheets you were given: _____________________________________________________________________             [facility] - Preparing for Surgery Before surgery, you can play an important role.  Because skin is not sterile, your skin needs to be as free of germs as possible.  You can reduce the number of germs on your skin by washing with CHG (chlorahexidine gluconate) soap before surgery.  CHG is an antiseptic cleaner which kills germs and bonds with the skin to continue killing germs even after washing. Please DO NOT use if you have an allergy to CHG or antibacterial soaps.  If your skin becomes reddened/irritated stop using the CHG and inform your nurse when you arrive at Short Stay. Do not shave (including legs and underarms) for at least 48 hours prior to the first CHG shower.  You may shave your face/neck. Please follow these instructions carefully:  1.  Shower with CHG  Soap the night before surgery and the  morning of Surgery.  2.  If you choose to wash your hair, wash your hair first as usual with your  normal  shampoo.  3.  After you shampoo, rinse your hair and body thoroughly to remove the  shampoo.                           4.  Use CHG as you would any other liquid soap.  You can apply chg directly  to the skin and wash                       Gently with a scrungie or clean washcloth.  5.  Apply the CHG Soap to your body ONLY FROM THE NECK DOWN.   Do not use on face/ open  Wound or open sores. Avoid contact with eyes, ears mouth and genitals (private parts).                       Wash face,  Genitals (private parts) with your normal soap.             6.  Wash thoroughly, paying special attention to the area where your surgery  will be performed.  7.  Thoroughly rinse your body with warm water from the neck down.  8.  DO NOT shower/wash with your normal soap after using and rinsing off  the CHG Soap.                9.  Pat yourself dry with a clean towel.            10.  Wear clean pajamas.            11.  Place clean sheets on your bed the night of your first shower and do not  sleep with pets. Day of Surgery : Do not apply any lotions/deodorants the morning of surgery.  Please wear clean clothes to the hospital/surgery center.  FAILURE TO FOLLOW THESE INSTRUCTIONS MAY RESULT IN THE CANCELLATION OF YOUR SURGERY PATIENT SIGNATURE_________________________________  NURSE SIGNATURE__________________________________  ________________________________________________________________________   Adam Phenix  An incentive spirometer is a tool that can help keep your lungs clear and active. This tool measures how well you are filling your lungs with each breath. Taking long deep breaths may help reverse or decrease the chance of developing breathing (pulmonary) problems (especially infection) following:  A long period of time  when you are unable to move or be active. BEFORE THE PROCEDURE   If the spirometer includes an indicator to show your best effort, your nurse or respiratory therapist will set it to a desired goal.  If possible, sit up straight or lean slightly forward. Try not to slouch.  Hold the incentive spirometer in an upright position. INSTRUCTIONS FOR USE  1. Sit on the edge of your bed if possible, or sit up as far as you can in bed or on a chair. 2. Hold the incentive spirometer in an upright position. 3. Breathe out normally. 4. Place the mouthpiece in your mouth and seal your lips tightly around it. 5. Breathe in slowly and as deeply as possible, raising the piston or the ball toward the top of the column. 6. Hold your breath for 3-5 seconds or for as long as possible. Allow the piston or ball to fall to the bottom of the column. 7. Remove the mouthpiece from your mouth and breathe out normally. 8. Rest for a few seconds and repeat Steps 1 through 7 at least 10 times every 1-2 hours when you are awake. Take your time and take a few normal breaths between deep breaths. 9. The spirometer may include an indicator to show your best effort. Use the indicator as a goal to work toward during each repetition. 10. After each set of 10 deep breaths, practice coughing to be sure your lungs are clear. If you have an incision (the cut made at the time of surgery), support your incision when coughing by placing a pillow or rolled up towels firmly against it. Once you are able to get out of bed, walk around indoors and cough well. You may stop using the incentive spirometer when instructed by your caregiver.  RISKS AND COMPLICATIONS  Take your time so you do not get  dizzy or light-headed.  If you are in pain, you may need to take or ask for pain medication before doing incentive spirometry. It is harder to take a deep breath if you are having pain. AFTER USE  Rest and breathe slowly and easily.  It can be  helpful to keep track of a log of your progress. Your caregiver can provide you with a simple table to help with this. If you are using the spirometer at home, follow these instructions: [place] IF:   You are having difficultly using the spirometer.  You have trouble using the spirometer as often as instructed.  Your pain medication is not giving enough relief while using the spirometer.  You develop fever of 100.5 F (38.1 C) or higher. SEEK IMMEDIATE MEDICAL CARE IF:   You cough up bloody sputum that had not been present before.  You develop fever of 102 F (38.9 C) or greater.  You develop worsening pain at or near the incision site. MAKE SURE YOU:   Understand these instructions.  Will watch your condition.  Will get help right away if you are not doing well or get worse. Document Released: 09/01/2006 Document Revised: 07/14/2011 Document Reviewed: 11/02/2006 Exit[employer] Patient Information 2014 Exit[employer], [place].   ________________________________________________________________________  WHAT IS A BLOOD TRANSFUSION? Blood Transfusion Information  A transfusion is the replacement of blood or some of its parts. Blood is made up of multiple cells which provide different functions.  Red blood cells carry oxygen and are used for blood loss replacement.  White blood cells fight against infection.  Platelets control bleeding.  Plasma helps clot blood.  Other blood products are available for specialized needs, such as hemophilia or other clotting disorders. BEFORE THE TRANSFUSION  Who gives blood for transfusions?   Healthy volunteers who are fully evaluated to make sure their blood is safe. This is blood bank blood. Transfusion therapy is the safest it has ever been in the practice of medicine. Before blood is taken from a donor, a complete history is taken to make sure that person has no history of diseases nor engages in risky social behavior (examples are  intravenous drug use or sexual activity with multiple partners). The donor's travel history is screened to minimize risk of transmitting infections, such as malaria. The donated blood is tested for signs of infectious diseases, such as HIV and hepatitis. The blood is then tested to be sure it is compatible with you in order to minimize the chance of a transfusion reaction. If you or a relative donates blood, this is often done in anticipation of surgery and is not appropriate for emergency situations. It takes many days to process the donated blood. RISKS AND COMPLICATIONS Although transfusion therapy is very safe and saves many lives, the main dangers of transfusion include:   Getting an infectious disease.  Developing a transfusion reaction. This is an allergic reaction to something in the blood you were given. Every precaution is taken to prevent this. The decision to have a blood transfusion has been considered carefully by your caregiver before blood is given. Blood is not given unless the benefits outweigh the risks. AFTER THE TRANSFUSION  Right after receiving a blood transfusion, you will usually feel much better and more energetic. This is especially true if your red blood cells have gotten low (anemic). The transfusion raises the level of the red blood cells which carry oxygen, and this usually causes an energy increase.  The nurse administering the transfusion will  monitor you carefully for complications. HOME CARE INSTRUCTIONS  No special instructions are needed after a transfusion. You may find your energy is better. Speak with your caregiver about any limitations on activity for underlying diseases you may have. SEEK MEDICAL CARE IF:   Your condition is not improving after your transfusion.  You develop redness or irritation at the intravenous (IV) site. SEEK IMMEDIATE MEDICAL CARE IF:  Any of the following symptoms occur over the next 12 hours:  Shaking chills.  You have a  temperature by mouth above 102 F (38.9 C), not controlled by medicine.  Chest, back, or muscle pain.  People around you feel you are not acting correctly or are confused.  Shortness of breath or difficulty breathing.  Dizziness and fainting.  You get a rash or develop hives.  You have a decrease in urine output.  Your urine turns a dark color or changes to pink, red, or brown. Any of the following symptoms occur over the next 10 days:  You have a temperature by mouth above 102 F (38.9 C), not controlled by medicine.  Shortness of breath.  Weakness after normal activity.  The white part of the eye turns yellow (jaundice).  You have a decrease in the amount of urine or are urinating less often.  Your urine turns a dark color or changes to pink, red, or brown. Document Released: 04/18/2000 Document Revised: 07/14/2011 Document Reviewed: 12/06/2007 [facility] Patient Information 2014 [place], [place].  _______________________________________________________________________

## 2017-01-12 ENCOUNTER — Encounter (HOSPITAL_COMMUNITY)
Admission: RE | Admit: 2017-01-12 | Discharge: 2017-01-12 | Disposition: A | Discharge status: Home / Self Care | Payer: 59 | Source: Ambulatory Visit | Attending: Orthopedic Surgery | Admitting: Orthopedic Surgery

## 2017-01-12 ENCOUNTER — Encounter (HOSPITAL_COMMUNITY): Payer: Self-pay

## 2017-01-12 DIAGNOSIS — Z01818 Encounter for other preprocedural examination: Secondary | ICD-10-CM | POA: Insufficient documentation

## 2017-01-12 DIAGNOSIS — M1711 Unilateral primary osteoarthritis, right knee: Secondary | ICD-10-CM | POA: Diagnosis not present

## 2017-01-12 HISTORY — DX: Essential (primary) hypertension: I10

## 2017-01-12 LAB — COMPREHENSIVE METABOLIC PANEL
ALBUMIN: 4.3 g/dL (ref 3.5–5.0)
ALT: 23 U/L (ref 14–54)
AST: 25 U/L (ref 15–41)
Alkaline Phosphatase: 66 U/L (ref 38–126)
Anion gap: 7 (ref 5–15)
BUN: 18 mg/dL (ref 6–20)
CHLORIDE: 106 mmol/L (ref 101–111)
CO2: 28 mmol/L (ref 22–32)
Calcium: 9.2 mg/dL (ref 8.9–10.3)
Creatinine, Ser: 0.63 mg/dL (ref 0.44–1.00)
GFR calc Af Amer: >60 mL/min (ref >60)
GFR calc non Af Amer: >60 mL/min (ref >60)
GLUCOSE: 156 mg/dL — AB (ref 65–99)
POTASSIUM: 4.3 mmol/L (ref 3.5–5.1)
Sodium: 141 mmol/L (ref 135–145)
Total Bilirubin: 0.8 mg/dL (ref 0.3–1.2)
Total Protein: 7.4 g/dL (ref 6.5–8.1)

## 2017-01-12 LAB — CBC
HCT: 38.9 % (ref 36.0–46.0)
Hemoglobin: 13 g/dL (ref 12.0–15.0)
MCH: 29.7 pg (ref 26.0–34.0)
MCHC: 33.4 g/dL (ref 30.0–36.0)
MCV: 89 fL (ref 78.0–100.0)
Platelets: 271 10*3/uL (ref 150–400)
RBC: 4.37 MIL/uL (ref 3.87–5.11)
RDW: 12.6 % (ref 11.5–15.5)
WBC: 4.9 10*3/uL (ref 4.0–10.5)

## 2017-01-12 LAB — PROTIME-INR
INR: 1.05
Prothrombin Time: 13.6 seconds (ref 11.4–15.2)

## 2017-01-12 LAB — SURGICAL PCR SCREEN
MRSA, PCR: NEGATIVE
STAPHYLOCOCCUS AUREUS: POSITIVE — AB

## 2017-01-12 LAB — ABO/RH: ABO/RH(D): AB POS

## 2017-01-12 LAB — APTT: APTT: 29 s (ref 24–36)

## 2017-01-13 ENCOUNTER — Other Ambulatory Visit: Payer: Self-pay | Admitting: Family

## 2017-01-19 ENCOUNTER — Encounter (HOSPITAL_COMMUNITY): Payer: Self-pay | Admitting: *Deleted

## 2017-01-19 ENCOUNTER — Encounter (HOSPITAL_COMMUNITY)
Admission: RE | Disposition: A | Discharge status: Home / Self Care | Payer: Self-pay | Source: Home / Self Care | Attending: Orthopedic Surgery

## 2017-01-19 ENCOUNTER — Inpatient Hospital Stay (HOSPITAL_COMMUNITY): Payer: 59 | Admitting: Certified Registered Nurse Anesthetist

## 2017-01-19 ENCOUNTER — Inpatient Hospital Stay (HOSPITAL_COMMUNITY)
Admission: RE | Admit: 2017-01-19 | Discharge: 2017-01-21 | DRG: 470 | Disposition: A | Discharge status: Home / Self Care | Payer: 59 | Attending: Orthopedic Surgery | Admitting: Orthopedic Surgery

## 2017-01-19 DIAGNOSIS — I10 Essential (primary) hypertension: Secondary | ICD-10-CM | POA: Diagnosis present

## 2017-01-19 DIAGNOSIS — M179 Osteoarthritis of knee, unspecified: Secondary | ICD-10-CM | POA: Diagnosis present

## 2017-01-19 DIAGNOSIS — Z79891 Long term (current) use of opiate analgesic: Secondary | ICD-10-CM | POA: Diagnosis not present

## 2017-01-19 DIAGNOSIS — Z791 Long term (current) use of non-steroidal anti-inflammatories (NSAID): Secondary | ICD-10-CM | POA: Diagnosis not present

## 2017-01-19 DIAGNOSIS — I739 Peripheral vascular disease, unspecified: Secondary | ICD-10-CM | POA: Diagnosis present

## 2017-01-19 DIAGNOSIS — M1710 Unilateral primary osteoarthritis, unspecified knee: Secondary | ICD-10-CM | POA: Diagnosis present

## 2017-01-19 DIAGNOSIS — Z85828 Personal history of other malignant neoplasm of skin: Secondary | ICD-10-CM | POA: Diagnosis not present

## 2017-01-19 DIAGNOSIS — M1711 Unilateral primary osteoarthritis, right knee: Principal | ICD-10-CM | POA: Diagnosis present

## 2017-01-19 HISTORY — PX: TOTAL KNEE ARTHROPLASTY: SHX125

## 2017-01-19 LAB — TYPE AND SCREEN
ABO/RH(D): AB POS
ANTIBODY SCREEN: NEGATIVE

## 2017-01-19 SURGERY — ARTHROPLASTY, KNEE, TOTAL
Anesthesia: Spinal | Site: Knee | Laterality: Right

## 2017-01-19 MED ORDER — TRAMADOL HCL 50 MG PO TABS
50.0000 mg | ORAL_TABLET | Freq: Four times a day (QID) | ORAL | Status: DC | PRN
  Administered 2017-01-19 – 2017-01-21 (×4): 100 mg via ORAL
  Filled 2017-01-19 (×4): qty 2

## 2017-01-19 MED ORDER — BUPIVACAINE HCL 0.25 % IJ SOLN
INTRAMUSCULAR | Status: AC
  Filled 2017-01-19: qty 1

## 2017-01-19 MED ORDER — CEFAZOLIN SODIUM-DEXTROSE 2-4 GM/100ML-% IV SOLN
2.0000 g | INTRAVENOUS | Status: AC
  Administered 2017-01-19: 2 g via INTRAVENOUS

## 2017-01-19 MED ORDER — BUPIVACAINE IN DEXTROSE 0.75-8.25 % IT SOLN
INTRATHECAL | Status: DC | PRN
  Administered 2017-01-19: 1.7 mL via INTRATHECAL

## 2017-01-19 MED ORDER — METHOCARBAMOL 1000 MG/10ML IJ SOLN
500.0000 mg | Freq: Four times a day (QID) | INTRAVENOUS | Status: DC | PRN
  Filled 2017-01-19: qty 5

## 2017-01-19 MED ORDER — PHENOL 1.4 % MT LIQD
1.0000 | OROMUCOSAL | Status: DC | PRN
  Filled 2017-01-19: qty 177

## 2017-01-19 MED ORDER — FENTANYL CITRATE (PF) 100 MCG/2ML IJ SOLN
INTRAMUSCULAR | Status: AC
  Filled 2017-01-19: qty 2

## 2017-01-19 MED ORDER — ACETAMINOPHEN 500 MG PO TABS
1000.0000 mg | ORAL_TABLET | Freq: Four times a day (QID) | ORAL | Status: AC
  Administered 2017-01-19 – 2017-01-20 (×4): 1000 mg via ORAL
  Filled 2017-01-19 (×4): qty 2

## 2017-01-19 MED ORDER — DIPHENHYDRAMINE HCL 12.5 MG/5ML PO ELIX
12.5000 mg | ORAL_SOLUTION | ORAL | Status: DC | PRN
  Administered 2017-01-20: 12.5 mg via ORAL
  Filled 2017-01-19 (×2): qty 10

## 2017-01-19 MED ORDER — RIVAROXABAN 10 MG PO TABS
10.0000 mg | ORAL_TABLET | Freq: Every day | ORAL | Status: DC
  Administered 2017-01-20 – 2017-01-21 (×2): 10 mg via ORAL
  Filled 2017-01-19 (×2): qty 1

## 2017-01-19 MED ORDER — MIDAZOLAM HCL 2 MG/2ML IJ SOLN
INTRAMUSCULAR | Status: AC
  Filled 2017-01-19: qty 2

## 2017-01-19 MED ORDER — ACETAMINOPHEN 325 MG PO TABS: 650.0000 mg | ORAL_TABLET | Freq: Four times a day (QID) | ORAL | Status: DC | PRN

## 2017-01-19 MED ORDER — METOCLOPRAMIDE HCL 5 MG PO TABS: 5.0000 mg | ORAL_TABLET | Freq: Three times a day (TID) | ORAL | Status: DC | PRN

## 2017-01-19 MED ORDER — BUPIVACAINE LIPOSOME 1.3 % IJ SUSP
20.0000 mL | Freq: Once | INTRAMUSCULAR | Status: DC
  Filled 2017-01-19: qty 20

## 2017-01-19 MED ORDER — GABAPENTIN 300 MG PO CAPS
300.0000 mg | ORAL_CAPSULE | Freq: Once | ORAL | Status: AC
  Administered 2017-01-19: 300 mg via ORAL

## 2017-01-19 MED ORDER — METHOCARBAMOL 500 MG PO TABS
500.0000 mg | ORAL_TABLET | Freq: Four times a day (QID) | ORAL | Status: DC | PRN
  Administered 2017-01-19 – 2017-01-21 (×6): 500 mg via ORAL
  Filled 2017-01-19 (×6): qty 1

## 2017-01-19 MED ORDER — POLYETHYLENE GLYCOL 3350 17 G PO PACK: 17.0000 g | PACK | Freq: Every day | ORAL | Status: DC | PRN

## 2017-01-19 MED ORDER — METOCLOPRAMIDE HCL 5 MG/ML IJ SOLN
5.0000 mg | Freq: Three times a day (TID) | INTRAMUSCULAR | Status: DC | PRN
  Administered 2017-01-20: 15:00:00 10 mg via INTRAVENOUS
  Filled 2017-01-19: qty 2

## 2017-01-19 MED ORDER — TRANEXAMIC ACID 1000 MG/10ML IV SOLN
1000.0000 mg | INTRAVENOUS | Status: AC
  Administered 2017-01-19: 1000 mg via INTRAVENOUS
  Filled 2017-01-19: qty 1100

## 2017-01-19 MED ORDER — PROPOFOL 10 MG/ML IV BOLUS
INTRAVENOUS | Status: AC
  Filled 2017-01-19: qty 20

## 2017-01-19 MED ORDER — SODIUM CHLORIDE 0.9 % IV SOLN
INTRAVENOUS | Status: DC
  Administered 2017-01-19: 12:00:00 via INTRAVENOUS

## 2017-01-19 MED ORDER — ONDANSETRON HCL 4 MG/2ML IJ SOLN
INTRAMUSCULAR | Status: AC
  Filled 2017-01-19: qty 2

## 2017-01-19 MED ORDER — HYDROMORPHONE HCL 2 MG PO TABS
2.0000 mg | ORAL_TABLET | ORAL | Status: DC | PRN
  Administered 2017-01-19: 4 mg via ORAL
  Administered 2017-01-19 (×3): 2 mg via ORAL
  Administered 2017-01-20 (×2): 4 mg via ORAL
  Administered 2017-01-20: 2 mg via ORAL
  Administered 2017-01-20: 21:00:00 4 mg via ORAL
  Administered 2017-01-21: 2 mg via ORAL
  Filled 2017-01-19: qty 2
  Filled 2017-01-19 (×3): qty 1
  Filled 2017-01-19: qty 2
  Filled 2017-01-19: qty 1
  Filled 2017-01-19: qty 2
  Filled 2017-01-19: qty 1
  Filled 2017-01-19 (×2): qty 2

## 2017-01-19 MED ORDER — SODIUM CHLORIDE 0.9 % IR SOLN
Status: DC | PRN
  Administered 2017-01-19: 1000 mL

## 2017-01-19 MED ORDER — PHENYLEPHRINE HCL 10 MG/ML IJ SOLN
INTRAMUSCULAR | Status: DC | PRN
  Administered 2017-01-19 (×3): 40 ug via INTRAVENOUS

## 2017-01-19 MED ORDER — ACETAMINOPHEN 10 MG/ML IV SOLN
INTRAVENOUS | Status: AC
  Filled 2017-01-19: qty 100

## 2017-01-19 MED ORDER — LACTATED RINGERS IV SOLN
INTRAVENOUS | Status: DC
  Administered 2017-01-19 (×2): via INTRAVENOUS

## 2017-01-19 MED ORDER — DEXAMETHASONE SODIUM PHOSPHATE 10 MG/ML IJ SOLN
INTRAMUSCULAR | Status: AC
  Filled 2017-01-19: qty 1

## 2017-01-19 MED ORDER — ACETAMINOPHEN 10 MG/ML IV SOLN
1000.0000 mg | Freq: Once | INTRAVENOUS | Status: AC
  Administered 2017-01-19: 1000 mg via INTRAVENOUS

## 2017-01-19 MED ORDER — BUPIVACAINE LIPOSOME 1.3 % IJ SUSP
INTRAMUSCULAR | Status: DC | PRN
  Administered 2017-01-19: 20 mL

## 2017-01-19 MED ORDER — ONDANSETRON HCL 4 MG/2ML IJ SOLN
4.0000 mg | Freq: Four times a day (QID) | INTRAMUSCULAR | Status: DC | PRN
  Administered 2017-01-20: 10:00:00 4 mg via INTRAVENOUS
  Filled 2017-01-19: qty 2

## 2017-01-19 MED ORDER — ACETAMINOPHEN 650 MG RE SUPP: 650.0000 mg | Freq: Four times a day (QID) | RECTAL | Status: DC | PRN

## 2017-01-19 MED ORDER — STERILE WATER FOR IRRIGATION IR SOLN
Status: DC | PRN
  Administered 2017-01-19: 2000 mL

## 2017-01-19 MED ORDER — CEFAZOLIN SODIUM-DEXTROSE 2-4 GM/100ML-% IV SOLN
2.0000 g | Freq: Four times a day (QID) | INTRAVENOUS | Status: AC
  Administered 2017-01-19 (×2): 2 g via INTRAVENOUS
  Filled 2017-01-19 (×2): qty 100

## 2017-01-19 MED ORDER — SODIUM CHLORIDE 0.9 % IJ SOLN
INTRAMUSCULAR | Status: DC | PRN
  Administered 2017-01-19: 60 mL

## 2017-01-19 MED ORDER — PROPOFOL 10 MG/ML IV BOLUS
INTRAVENOUS | Status: AC
  Filled 2017-01-19: qty 40

## 2017-01-19 MED ORDER — PROPOFOL 500 MG/50ML IV EMUL
INTRAVENOUS | Status: DC | PRN
  Administered 2017-01-19: 75 ug/kg/min via INTRAVENOUS

## 2017-01-19 MED ORDER — EPHEDRINE SULFATE 50 MG/ML IJ SOLN
INTRAMUSCULAR | Status: DC | PRN
  Administered 2017-01-19 (×2): 5 mg via INTRAVENOUS

## 2017-01-19 MED ORDER — FENTANYL CITRATE (PF) 100 MCG/2ML IJ SOLN
INTRAMUSCULAR | Status: DC | PRN
  Administered 2017-01-19 (×2): 50 ug via INTRAVENOUS

## 2017-01-19 MED ORDER — CEFAZOLIN SODIUM-DEXTROSE 2-4 GM/100ML-% IV SOLN
INTRAVENOUS | Status: AC
  Filled 2017-01-19: qty 100

## 2017-01-19 MED ORDER — CHLORHEXIDINE GLUCONATE 4 % EX LIQD: 60.0000 mL | Freq: Once | CUTANEOUS | Status: DC

## 2017-01-19 MED ORDER — DEXAMETHASONE SODIUM PHOSPHATE 10 MG/ML IJ SOLN
10.0000 mg | Freq: Once | INTRAMUSCULAR | Status: AC
  Administered 2017-01-19: 10 mg via INTRAVENOUS

## 2017-01-19 MED ORDER — PHENYLEPHRINE 40 MCG/ML (10ML) SYRINGE FOR IV PUSH (FOR BLOOD PRESSURE SUPPORT)
PREFILLED_SYRINGE | INTRAVENOUS | Status: AC
  Filled 2017-01-19: qty 10

## 2017-01-19 MED ORDER — GABAPENTIN 300 MG PO CAPS
ORAL_CAPSULE | ORAL | Status: AC
  Filled 2017-01-19: qty 1

## 2017-01-19 MED ORDER — MIDAZOLAM HCL 5 MG/5ML IJ SOLN
INTRAMUSCULAR | Status: DC | PRN
Start: 2017-01-19 — End: 2017-01-19
  Administered 2017-01-19 (×2): 1 mg via INTRAVENOUS

## 2017-01-19 MED ORDER — SODIUM CHLORIDE 0.9 % IJ SOLN
INTRAMUSCULAR | Status: AC
  Filled 2017-01-19: qty 10

## 2017-01-19 MED ORDER — DOCUSATE SODIUM 100 MG PO CAPS
100.0000 mg | ORAL_CAPSULE | Freq: Two times a day (BID) | ORAL | Status: DC
  Administered 2017-01-19 – 2017-01-21 (×4): 100 mg via ORAL
  Filled 2017-01-19 (×5): qty 1

## 2017-01-19 MED ORDER — BISACODYL 10 MG RE SUPP: 10.0000 mg | Freq: Every day | RECTAL | Status: DC | PRN

## 2017-01-19 MED ORDER — TRANEXAMIC ACID 1000 MG/10ML IV SOLN
1000.0000 mg | Freq: Once | INTRAVENOUS | Status: AC
  Administered 2017-01-19: 1000 mg via INTRAVENOUS
  Filled 2017-01-19: qty 1100

## 2017-01-19 MED ORDER — HYDROMORPHONE HCL-NACL 0.5-0.9 MG/ML-% IV SOSY: 1.0000 mg | PREFILLED_SYRINGE | INTRAVENOUS | Status: DC | PRN

## 2017-01-19 MED ORDER — LORATADINE 10 MG PO TABS
10.0000 mg | ORAL_TABLET | Freq: Every day | ORAL | Status: DC | PRN
  Administered 2017-01-20: 23:00:00 10 mg via ORAL
  Filled 2017-01-19 (×2): qty 1

## 2017-01-19 MED ORDER — ONDANSETRON HCL 4 MG PO TABS: 4.0000 mg | ORAL_TABLET | Freq: Four times a day (QID) | ORAL | Status: DC | PRN

## 2017-01-19 MED ORDER — ONDANSETRON HCL 4 MG/2ML IJ SOLN
INTRAMUSCULAR | Status: DC | PRN
  Administered 2017-01-19: 4 mg via INTRAVENOUS

## 2017-01-19 MED ORDER — 0.9 % SODIUM CHLORIDE (POUR BTL) OPTIME
TOPICAL | Status: DC | PRN
  Administered 2017-01-19: 1000 mL

## 2017-01-19 MED ORDER — FLEET ENEMA 7-19 GM/118ML RE ENEM: 1.0000 | ENEMA | Freq: Once | RECTAL | Status: DC | PRN

## 2017-01-19 MED ORDER — MENTHOL 3 MG MT LOZG: 1.0000 | LOZENGE | OROMUCOSAL | Status: DC | PRN

## 2017-01-19 MED ORDER — DEXAMETHASONE SODIUM PHOSPHATE 10 MG/ML IJ SOLN
10.0000 mg | Freq: Once | INTRAMUSCULAR | Status: AC
  Administered 2017-01-20: 10:00:00 10 mg via INTRAVENOUS
  Filled 2017-01-19: qty 1

## 2017-01-19 MED ORDER — SODIUM CHLORIDE 0.9 % IJ SOLN
INTRAMUSCULAR | Status: AC
  Filled 2017-01-19: qty 50

## 2017-01-19 SURGICAL SUPPLY — 51 items
BAG DECANTER FOR FLEXI CONT (MISCELLANEOUS) ×1 IMPLANT
BAG SPEC THK2 15X12 ZIP CLS (MISCELLANEOUS) ×1
BAG ZIPLOCK 12X15 (MISCELLANEOUS) ×3 IMPLANT
BANDAGE ACE 6X5 VEL STRL LF (GAUZE/BANDAGES/DRESSINGS) ×3 IMPLANT
BLADE SAG 18X100X1.27 (BLADE) ×3 IMPLANT
BLADE SAW SGTL 11.0X1.19X90.0M (BLADE) ×3 IMPLANT
BOWL SMART MIX CTS (DISPOSABLE) ×3 IMPLANT
CAP KNEE TOTAL 3 SIGMA ×2 IMPLANT
CEMENT HV SMART SET (Cement) ×6 IMPLANT
CLOSURE WOUND 1/2 X4 (GAUZE/BANDAGES/DRESSINGS) ×2
COVER SURGICAL LIGHT HANDLE (MISCELLANEOUS) ×3 IMPLANT
CUFF TOURN SGL QUICK 34 (TOURNIQUET CUFF) ×3
CUFF TRNQT CYL 34X4X40X1 (TOURNIQUET CUFF) ×1 IMPLANT
DECANTER SPIKE VIAL GLASS SM (MISCELLANEOUS) ×3 IMPLANT
DRAPE U-SHAPE 47X51 STRL (DRAPES) ×3 IMPLANT
DRSG ADAPTIC 3X8 NADH LF (GAUZE/BANDAGES/DRESSINGS) ×3 IMPLANT
DRSG PAD ABDOMINAL 8X10 ST (GAUZE/BANDAGES/DRESSINGS) ×3 IMPLANT
DURAPREP 26ML APPLICATOR (WOUND CARE) ×3 IMPLANT
ELECT REM PT RETURN 15FT ADLT (MISCELLANEOUS) ×3 IMPLANT
EVACUATOR 1/8 PVC DRAIN (DRAIN) ×3 IMPLANT
GAUZE SPONGE 4X4 12PLY STRL (GAUZE/BANDAGES/DRESSINGS) ×3 IMPLANT
GLOVE BIO SURGEON STRL SZ7.5 (GLOVE) ×2 IMPLANT
GLOVE BIO SURGEON STRL SZ8 (GLOVE) ×3 IMPLANT
GLOVE BIOGEL PI IND STRL 6.5 (GLOVE) IMPLANT
GLOVE BIOGEL PI IND STRL 8 (GLOVE) ×1 IMPLANT
GLOVE BIOGEL PI INDICATOR 6.5 (GLOVE)
GLOVE BIOGEL PI INDICATOR 8 (GLOVE) ×4
GLOVE SURG SS PI 6.5 STRL IVOR (GLOVE) IMPLANT
GOWN STRL REUS W/TWL LRG LVL3 (GOWN DISPOSABLE) ×3 IMPLANT
GOWN STRL REUS W/TWL XL LVL3 (GOWN DISPOSABLE) ×2 IMPLANT
HANDPIECE INTERPULSE COAX TIP (DISPOSABLE) ×3
IMMOBILIZER KNEE 20 (SOFTGOODS) ×3
IMMOBILIZER KNEE 20 THIGH 36 (SOFTGOODS) ×1 IMPLANT
MANIFOLD NEPTUNE II (INSTRUMENTS) ×3 IMPLANT
NS IRRIG 1000ML POUR BTL (IV SOLUTION) ×3 IMPLANT
PACK TOTAL KNEE CUSTOM (KITS) ×3 IMPLANT
PADDING CAST COTTON 6X4 STRL (CAST SUPPLIES) ×7 IMPLANT
POSITIONER SURGICAL ARM (MISCELLANEOUS) ×3 IMPLANT
SET HNDPC FAN SPRY TIP SCT (DISPOSABLE) ×1 IMPLANT
STRIP CLOSURE SKIN 1/2X4 (GAUZE/BANDAGES/DRESSINGS) ×4 IMPLANT
SUT MNCRL AB 4-0 PS2 18 (SUTURE) ×3 IMPLANT
SUT STRATAFIX 0 PDS 27 VIOLET (SUTURE) ×3
SUT VIC AB 2-0 CT1 27 (SUTURE) ×9
SUT VIC AB 2-0 CT1 TAPERPNT 27 (SUTURE) ×3 IMPLANT
SUTURE STRATFX 0 PDS 27 VIOLET (SUTURE) ×1 IMPLANT
SYR 30ML LL (SYRINGE) ×6 IMPLANT
TRAY FOLEY CATH 14FRSI W/METER (CATHETERS) ×2 IMPLANT
TRAY FOLEY W/METER SILVER 16FR (SET/KITS/TRAYS/PACK) ×1 IMPLANT
WATER STERILE IRR 1000ML POUR (IV SOLUTION) ×6 IMPLANT
WRAP KNEE MAXI GEL POST OP (GAUZE/BANDAGES/DRESSINGS) ×3 IMPLANT
YANKAUER SUCT BULB TIP 10FT TU (MISCELLANEOUS) ×3 IMPLANT

## 2017-01-19 NOTE — Addendum Note (Signed)
Addendum  created 01/19/17 1108 by West Pugh, CRNA   Anesthesia Intra Flowsheets edited

## 2017-01-19 NOTE — Discharge Instructions (Addendum)
° °Dr. Frank Aluisio °Total Joint Specialist °[facility] °[address] °[place], [place] [address] °[phone] ° °TOTAL KNEE REPLACEMENT POSTOPERATIVE DIRECTIONS ° °Knee Rehabilitation, Guidelines Following Surgery  °Results after knee surgery are often greatly improved when you follow the exercise, range of motion and muscle strengthening exercises prescribed by your doctor. Safety measures are also important to protect the knee from further injury. Any time any of these exercises cause you to have increased pain or swelling in your knee joint, decrease the amount until you are comfortable again and slowly increase them. If you have problems or questions, call your caregiver or physical therapist for advice.  ° °HOME CARE INSTRUCTIONS  °Remove items at home which could result in a fall. This includes throw rugs or furniture in walking pathways.  °· ICE to the affected knee every three hours for 30 minutes at a time and then as needed for pain and swelling.  Continue to use ice on the knee for pain and swelling from surgery. You may notice swelling that will progress down to the foot and ankle.  This is normal after surgery.  Elevate the leg when you are not up walking on it.   °· Continue to use the breathing machine which will help keep your temperature down.  It is common for your temperature to cycle up and down following surgery, especially at night when you are not up moving around and exerting yourself.  The breathing machine keeps your lungs expanded and your temperature down. °· Do not place pillow under knee, focus on keeping the knee straight while resting ° °DIET °You may resume your previous home diet once your are discharged from the hospital. ° °DRESSING / WOUND CARE / SHOWERING °You may shower 3 days after surgery, but keep the wounds dry during showering.  You may use an occlusive plastic wrap (Press'n Seal for example), NO SOAKING/SUBMERGING IN THE BATHTUB.  If the  bandage gets wet, change with a clean dry gauze.  If the incision gets wet, pat the wound dry with a clean towel. °You may start showering once you are discharged home but do not submerge the incision under water. Just pat the incision dry and apply a dry gauze dressing on daily. °Change the surgical dressing daily and reapply a dry dressing each time. ° °ACTIVITY °Walk with your walker as instructed. °Use walker as long as suggested by your caregivers. °Avoid periods of inactivity such as sitting longer than an hour when not asleep. This helps prevent blood clots.  °You may resume a sexual relationship in one month or when given the OK by your doctor.  °You may return to work once you are cleared by your doctor.  °Do not drive a car for 6 weeks or until released by you surgeon.  °Do not drive while taking narcotics. ° °WEIGHT BEARING °Weight bearing as tolerated with assist device (walker, cane, etc) as directed, use it as long as suggested by your surgeon or therapist, typically at least 4-6 weeks. ° °POSTOPERATIVE CONSTIPATION PROTOCOL °Constipation - defined medically as fewer than three stools per week and severe constipation as less than one stool per week. ° °One of the most common issues patients have following surgery is constipation.  Even if you have a regular bowel pattern at home, your normal regimen is likely to be disrupted due to multiple reasons following surgery.  Combination of anesthesia, postoperative narcotics, change in appetite and fluid intake all can affect your bowels.    In order to avoid complications following surgery, here are some recommendations in order to help you during your recovery period. ° °Colace (docusate) - Pick up an over-the-counter form of Colace or another stool softener and take twice a day as long as you are requiring postoperative pain medications.  Take with a full glass of water daily.  If you experience loose stools or diarrhea, hold the colace until you stool forms  back up.  If your symptoms do not get better within 1 week or if they get worse, check with your doctor. ° °Dulcolax (bisacodyl) - Pick up over-the-counter and take as directed by the product packaging as needed to assist with the movement of your bowels.  Take with a full glass of water.  Use this product as needed if not relieved by Colace only.  ° °MiraLax (polyethylene glycol) - Pick up over-the-counter to have on hand.  MiraLax is a solution that will increase the amount of water in your bowels to assist with bowel movements.  Take as directed and can mix with a glass of water, juice, soda, coffee, or tea.  Take if you go more than two days without a movement. °Do not use MiraLax more than once per day. Call your doctor if you are still constipated or irregular after using this medication for 7 days in a row. ° °If you continue to have problems with postoperative constipation, please contact the office for further assistance and recommendations.  If you experience "the worst abdominal pain ever" or develop nausea or vomiting, please contact the office immediatly for further recommendations for treatment. ° °ITCHING ° If you experience itching with your medications, try taking only a single pain pill, or even half a pain pill at a time.  You can also use Benadryl over the counter for itching or also to help with sleep.  ° °TED HOSE STOCKINGS °Wear the elastic stockings on both legs for three weeks following surgery during the day but you may remove then at night for sleeping. ° °MEDICATIONS °See your medication summary on the “After Visit Summary” that the nursing staff will review with you prior to discharge.  You may have some home medications which will be placed on hold until you complete the course of blood thinner medication.  It is important for you to complete the blood thinner medication as prescribed by your surgeon.  Continue your approved medications as instructed at time of  discharge. ° °PRECAUTIONS °If you experience chest pain or shortness of breath - call 911 immediately for transfer to the hospital emergency department.  °If you develop a fever greater that 101 F, purulent drainage from wound, increased redness or drainage from wound, foul odor from the wound/dressing, or calf pain - CONTACT YOUR SURGEON.   °                                                °FOLLOW-UP APPOINTMENTS °Make sure you keep all of your appointments after your operation with your surgeon and caregivers. You should call the office at the above phone number and make an appointment for approximately two weeks after the date of your surgery or on the date instructed by your surgeon outlined in the "After Visit Summary". ° ° °RANGE OF MOTION AND STRENGTHENING EXERCISES  °Rehabilitation of the knee is important following a knee injury or   an operation. After just a few days of immobilization, the muscles of the thigh which control the knee become weakened and shrink (atrophy). Knee exercises are designed to build up the tone and strength of the thigh muscles and to improve knee motion. Often times heat used for twenty to thirty minutes before working out will loosen up your tissues and help with improving the range of motion but do not use heat for the first two weeks following surgery. These exercises can be done on a training (exercise) mat, on the floor, on a table or on a bed. Use what ever works the best and is most comfortable for you Knee exercises include:  °Leg Lifts - While your knee is still immobilized in a splint or cast, you can do straight leg raises. Lift the leg to 60 degrees, hold for 3 sec, and slowly lower the leg. Repeat 10-20 times 2-3 times daily. Perform this exercise against resistance later as your knee gets better.  °Quad and Hamstring Sets - Tighten up the muscle on the front of the thigh (Quad) and hold for 5-10 sec. Repeat this 10-20 times hourly. Hamstring sets are done by pushing the  foot backward against an object and holding for 5-10 sec. Repeat as with quad sets.  °· Leg Slides: Lying on your back, slowly slide your foot toward your buttocks, bending your knee up off the floor (only go as far as is comfortable). Then slowly slide your foot back down until your leg is flat on the floor again. °· Angel Wings: Lying on your back spread your legs to the side as far apart as you can without causing discomfort.  °A rehabilitation program following serious knee injuries can speed recovery and prevent re-injury in the future due to weakened muscles. Contact your doctor or a physical therapist for more information on knee rehabilitation.  ° °IF YOU ARE TRANSFERRED TO A SKILLED REHAB FACILITY °If the patient is transferred to a skilled rehab facility following release from the hospital, a list of the current medications will be sent to the facility for the patient to continue.  When discharged from the skilled rehab facility, please have the facility set up the patient's Home Health Physical Therapy prior to being released. Also, the skilled facility will be responsible for providing the patient with their medications at time of release from the facility to include their pain medication, the muscle relaxants, and their blood thinner medication. If the patient is still at the rehab facility at time of the two week follow up appointment, the skilled rehab facility will also need to assist the patient in arranging follow up appointment in our office and any transportation needs. ° °MAKE SURE YOU:  °Understand these instructions.  °Get help right away if you are not doing well or get worse.  ° ° °Pick up stool softner and laxative for home use following surgery while on pain medications. °Do not submerge incision under water. °Please use good hand washing techniques while changing dressing each day. °May shower starting three days after surgery. °Please use a clean towel to pat the incision dry following  showers. °Continue to use ice for pain and swelling after surgery. °Do not use any lotions or creams on the incision until instructed by your surgeon. ° °Take Xarelto for two and a half more weeks following discharge from the hospital, then discontinue Xarelto. °Once the patient has completed the Xarelto, they may resume the 81 mg Aspirin. ° ° ° °Information on   my medicine - XARELTO® (Rivaroxaban) ° °Why was Xarelto® prescribed for you? °Xarelto® was prescribed for you to reduce the risk of blood clots forming after orthopedic surgery. The medical term for these abnormal blood clots is venous thromboembolism (VTE). ° °What do you need to know about xarelto® ? °Take your Xarelto® ONCE DAILY at the same time every day. °You may take it either with or without food. ° °If you have difficulty swallowing the tablet whole, you may crush it and mix in applesauce just prior to taking your dose. ° °Take Xarelto® exactly as prescribed by your doctor and DO NOT stop taking Xarelto® without talking to the doctor who prescribed the medication.  Stopping without other VTE prevention medication to take the place of Xarelto® may increase your risk of developing a clot. ° °After discharge, you should have regular check-up appointments with your healthcare provider that is prescribing your Xarelto®.   ° °What do you do if you miss a dose? °If you miss a dose, take it as soon as you remember on the same day then continue your regularly scheduled once daily regimen the next day. Do not take two doses of Xarelto® on the same day.  ° °Important Safety Information °A possible side effect of Xarelto® is bleeding. You should call your healthcare provider right away if you experience any of the following: °? Bleeding from an injury or your nose that does not stop. °? Unusual colored urine (red or dark brown) or unusual colored stools (red or black). °? Unusual bruising for unknown reasons. °? A serious fall or if you hit your head (even if  there is no bleeding). ° °Some medicines may interact with Xarelto® and might increase your risk of bleeding while on Xarelto®. To help avoid this, consult your healthcare provider or pharmacist prior to using any new prescription or non-prescription medications, including herbals, vitamins, non-steroidal anti-inflammatory drugs (NSAIDs) and supplements. ° °This website has more information on Xarelto®: [email or web address].com. ° ° ° °

## 2017-01-19 NOTE — Anesthesia Preprocedure Evaluation (Signed)
Anesthesia Evaluation  Patient identified by MRN, date of birth, ID band Patient awake    Reviewed: Allergy & Precautions, NPO status , Patient's Chart, lab work & pertinent test results  Airway Mallampati: II  TM Distance: >3 FB     Dental   Pulmonary    breath sounds clear to auscultation       Cardiovascular hypertension, + Peripheral Vascular Disease   Rhythm:Regular Rate:Normal     Neuro/Psych    GI/Hepatic negative GI ROS, Neg liver ROS,   Endo/Other  negative endocrine ROS  Renal/GU negative Renal ROS     Musculoskeletal  (+) Arthritis ,   Abdominal   Peds  Hematology   Anesthesia Other Findings   Reproductive/Obstetrics                             Anesthesia Physical Anesthesia Plan  ASA: III  Anesthesia Plan: Spinal   Post-op Pain Management:    Induction: Intravenous  PONV Risk Score and Plan: 3 and Ondansetron, Dexamethasone, Midazolam, Propofol infusion and Treatment may vary due to age or medical condition  Airway Management Planned: Oral ETT and Simple Face Mask  Additional Equipment:   Intra-op Plan:   Post-operative Plan:   Informed Consent: I have reviewed the patients History and Physical, chart, labs and discussed the procedure including the risks, benefits and alternatives for the proposed anesthesia with the patient or authorized representative who has indicated his/her understanding and acceptance.   Dental advisory given  Plan Discussed with: CRNA  Anesthesia Plan Comments:         Anesthesia Quick Evaluation

## 2017-01-19 NOTE — Evaluation (Signed)
Physical Therapy Evaluation Patient Details Name: Mallory Hines MRN: [id number] DOB: 20-Jun-1950 Today's Date: 01/19/2017   History of Present Illness  right TKA  Clinical Impression  The patient complained of dizziness while ambulating. recovered with sitting down Pt admitted with above diagnosis. Pt currently with functional limitations due to the deficits listed below (see PT Problem List). Pt will benefit from skilled PT to increase their independence and safety with mobility to allow discharge to the venue listed below.       Follow Up Recommendations DC plan and follow up therapy as arranged by surgeon;Outpatient PT    Equipment Recommendations  None recommended by PT    Recommendations for Other Services       Precautions / Restrictions Precautions Precautions: Fall;Knee Required Braces or Orthoses: Knee Immobilizer - Right Knee Immobilizer - Right: Discontinue once straight leg raise with < 10 degree lag      Mobility  Bed Mobility Overal bed mobility: Needs Assistance Bed Mobility: Supine to Sit     Supine to sit: Min assist     General bed mobility comments: cues for technique  Transfers Overall transfer level: Needs assistance Equipment used: Rolling walker (2 wheeled) Transfers: Sit to/from Stand Sit to Stand: Min assist         General transfer comment: cues for technique, right leg position  Ambulation/Gait Ambulation/Gait assistance: Min assist;+2 safety/equipment Ambulation Distance (Feet): 30 Feet Assistive device: Rolling walker (2 wheeled) Gait Pattern/deviations: Step-to pattern;Decreased stance time - right;Antalgic     General Gait Details: cues for sequence, coplained of feeling hot and fluushed and dizzy. Recliner brought up. BP173/74. recovered quickly.  Stairs            Wheelchair Mobility    Modified Rankin (Stroke Patients Only)       Balance                                             Pertinent  Vitals/Pain Pain Assessment: 0-10 Pain Score: 4  Pain Location: right knee Pain Descriptors / Indicators: Burning;Sore Pain Intervention(s): Monitored during session;Premedicated before session;Repositioned;Ice applied    Home Living Family/patient expects to be discharged to:: Private residence Living Arrangements: Spouse/significant other Available Help at Discharge: Family Type of Home: House Home Access: Stairs to enter Entrance Stairs-Rails: Psychiatric nurse of Steps: 7 Home Layout: Two level;Able to live on main level with bedroom/bathroom Home Equipment: Shower seat - built in;Walker - 2 wheels;Cane - single point      Prior Function Level of Independence: Independent               Hand Dominance        Extremity/Trunk Assessment   Upper Extremity Assessment Upper Extremity Assessment: Defer to OT evaluation    Lower Extremity Assessment Lower Extremity Assessment: RLE deficits/detail RLE Deficits / Details: + SLR    Cervical / Trunk Assessment Cervical / Trunk Assessment: Normal  Communication   Communication: No difficulties  Cognition Arousal/Alertness: Awake/alert Behavior During Therapy: WFL for tasks assessed/performed Overall Cognitive Status: Within Functional Limits for tasks assessed                                        General Comments      Exercises  Assessment/Plan    PT Assessment Patient needs continued PT services  PT Problem List Decreased strength;Decreased range of motion;Decreased activity tolerance;Decreased mobility;Decreased knowledge of precautions;Decreased safety awareness;Decreased knowledge of use of DME;Pain       PT Treatment Interventions DME instruction;Gait training;Stair training;Functional mobility training;Patient/family education;Therapeutic activities    PT Goals (Current goals can be found in the Care Plan section)  Acute Rehab PT Goals Patient Stated Goal: to  play PT Goal Formulation: With patient/family Time For Goal Achievement: 01/24/17 Potential to Achieve Goals: Good    Frequency 7X/week   Barriers to discharge        Co-evaluation               AM-PAC PT "6 Clicks" Daily Activity  Outcome Measure Difficulty turning over in bed (including adjusting bedclothes, sheets and blankets)?: Unable Difficulty moving from lying on back to sitting on the side of the bed? : Unable Difficulty sitting down on and standing up from a chair with arms (e.g., wheelchair, bedside commode, etc,.)?: Unable Help needed moving to and from a bed to chair (including a wheelchair)?: Total Help needed walking in hospital room?: Total Help needed climbing 3-5 steps with a railing? : Total 6 Click Score: 6    End of Session Equipment Utilized During Treatment: Gait belt;Right knee immobilizer Activity Tolerance: Patient tolerated treatment well Patient left: in chair;with call bell/phone within reach Nurse Communication: Mobility status PT Visit Diagnosis: Difficulty in walking, not elsewhere classified (R26.2);Pain Pain - Right/Left: Right Pain - part of body: Knee    Time: 2440-1027 PT Time Calculation (min) (ACUTE ONLY): 24 min   Charges:   PT Evaluation $PT Eval Low Complexity: 1 Low PT Treatments $Gait Training: 8-22 mins   PT G CodesTresa Endo PT [phone]   Claretha Cooper 01/19/2017, 6:42 PM

## 2017-01-19 NOTE — Anesthesia Postprocedure Evaluation (Signed)
Anesthesia Post Note  Patient: Mallory Hines  Procedure(s) Performed: Procedure(s) (LRB): RIGHT TOTAL KNEE ARTHROPLASTY (Right)     Patient location during evaluation: PACU Anesthesia Type: Spinal and General Pain management: pain level controlled Vital Signs Assessment: post-procedure vital signs reviewed and stable Respiratory status: spontaneous breathing Cardiovascular status: stable Anesthetic complications: no    Last Vitals:  Vitals:   01/19/17 1030 01/19/17 1100  BP: (!) 148/73   Pulse: (!) 52   Resp: 15   Temp:  (!) 36.3 C  SpO2: 100%     Last Pain:  Vitals:   01/19/17 1045  TempSrc:   PainSc: Asleep                 Landa Mullinax

## 2017-01-19 NOTE — Anesthesia Procedure Notes (Signed)
Procedure Name: MAC Date/Time: 01/19/2017 8:17 AM Performed by: West Pugh Pre-anesthesia Checklist: Patient identified, Emergency Drugs available, Suction available, Patient being monitored and Timeout performed Patient Re-evaluated:Patient Re-evaluated prior to induction Oxygen Delivery Method: Simple face mask Induction Type: IV induction Placement Confirmation: CO2 detector,  positive ETCO2 and breath sounds checked- equal and bilateral Dental Injury: Teeth and Oropharynx as per pre-operative assessment

## 2017-01-19 NOTE — H&P (View-Only) (Signed)
Mallory Hines DOB: July 28, 1950 Married / Language: English / Race: White Female Date of Admission:  01/19/2017 CC:  Right knee pain History of Present Illness  The patient is a 66 year old female who comes in for a preoperative History and Physical. The patient is scheduled for a right total knee arthroplasty to be performed by Dr. Dione Plover. Aluisio, MD at [facility] on 01/19/2017. The patient is a 66 year old female who presented for follow up of their knee. The patient is being followed for their right knee pain and osteoarthritis. They are now months out from Home injection. Symptoms reported include: pain and swelling. The patient feels that they are doing well and report their pain level to be mild. The following medication has been used for pain control: none. The patient has reported improvement of their symptoms with: viscosupplementation. Patient had a sudden pain posteriorly with tenderness a couple weeks ago, she was seen at the [facility] clinic and had a Doppler which was negative for DVT. She has now improved after taking a Prednisone dose pack. She is now improved from the recent pain and now ready to proceed with the surgery of the right knee. They have been treated conservatively in the past for the above stated problem and despite conservative measures, they continue to have progressive pain and severe functional limitations and dysfunction. They have failed non-operative management including home exercise, medications, and injections. It is felt that they would benefit from undergoing total joint replacement. Risks and benefits of the procedure have been discussed with the patient and they elect to proceed with surgery. There are no active contraindications to surgery such as ongoing infection or rapidly progressive neurological disease.  Problem List/Past Medical  Tear of right biceps muscle, initial encounter (J09.326Z)  Sprain of fifth toe of right foot, initial encounter  (T24.580D)  Dog bite, hand (X83.382N)  Primary osteoarthritis of right knee (M17.11)  Osteoarthritis, Knee (715.96)  Plantar fasciitis (M72.2) [05/28/2004]: Dermatographic urticaria (L50.3)  Diverticulitis Of Colon  High blood pressure  Skin Cancer  Shingles  Varicose veins  Diverticulosis  Urinary Tract Infection  Menopause   Allergies  SOME ANTI-INFLAMMATORIES [02/26/2010]: Codeine Phosphate *ANALGESICS - OPIOID*  Nausea. Mobic *ANALGESICS - ANTI-INFLAMMATORY*  Aleve *ANALGESICS - ANTI-INFLAMMATORY*  CeleBREX *ANALGESICS - ANTI-INFLAMMATORY*  Please note that the patient has been able to take Ibuprofen without difficulty.  Family History Cancer  Mother. mother Diabetes Mellitus  Sister. sister Heart Disease  Father. father Hypertension  Sister. sister  Social History  Children  2 Current drinker  10/03/2013: Currently drinks wine only occasionally per week Current work status  unemployed Alcohol use  current drinker; drinks wine; only occasionally per week Drug/Alcohol Rehab (Currently)  no Exercise  Exercises daily; does running / walking and individual sport Exercises weekly; does running / walking Living situation  live with spouse Marital status  married Drug/Alcohol Rehab (Previously)  no Most recent primary occupation  Homemaker No history of drug/alcohol rehab  Not under pain contract  Number of flights of stairs before winded  greater than 5 Pain Contract  no Illicit drug use  no Tobacco use  Never smoker. 10/03/2013 never smoker Tobacco / smoke exposure  no Post-Surgical Plans  Home With Family. Advance Directives  Living Will, Healthcare Power of [place].  Medication History  Benazepril HCl (20MG  Tablet, Oral) Active.   Past Surgical History Cesarean Delivery  1 time   Review of Systems General Not Present- Chills, Fatigue, Fever, Memory Loss, Night  Sweats, Weight Gain and Weight Loss. Skin Present-  Hives and Itching. Not Present- Eczema, Lesions and Rash. HEENT Not Present- Dentures, Double Vision, Headache, Hearing Loss, Tinnitus and Visual Loss. Respiratory Not Present- Allergies, Chronic Cough, Coughing up blood, Shortness of breath at rest and Shortness of breath with exertion. Cardiovascular Not Present- Chest Pain, Difficulty Breathing Lying Down, Murmur, Palpitations, Racing/skipping heartbeats and Swelling. Gastrointestinal Not Present- Abdominal Pain, Bloody Stool, Constipation, Diarrhea, Difficulty Swallowing, Heartburn, Jaundice, Loss of appetitie, Nausea and Vomiting. Female Genitourinary Not Present- Blood in Urine, Discharge, Flank Pain, Incontinence, Painful Urination, Urgency, Urinary frequency, Urinary Retention, Urinating at Night and Weak urinary stream. Musculoskeletal Present- Joint Pain. Not Present- Back Pain, Joint Swelling, Morning Stiffness, Muscle Pain, Muscle Weakness and Spasms. Neurological Not Present- Blackout spells, Difficulty with balance, Dizziness, Paralysis, Tremor and Weakness. Psychiatric Not Present- Insomnia.  Vitals Weight: 180 lb Height: 64in Body Surface Area: 1.87 m Body Mass Index: 30.9 kg/m  Pulse: 72 (Regular)  Resp.: 16 (Unlabored)  BP: 148/88 (Sitting, Right Arm, Standard)       Physical Exam  General Mental Status -Alert, cooperative and good historian. General Appearance-pleasant, Not in acute distress. Orientation-Oriented X3. Build & Nutrition-Well nourished and Well developed.  Head and Neck Head-normocephalic, atraumatic . Neck Global Assessment - supple, no bruit auscultated on the right, no bruit auscultated on the left.  Eye Vision-Wears corrective lenses(readers only). Pupil - Bilateral-Regular and Round. Motion - Bilateral-EOMI.  Chest and Lung Exam Auscultation Breath sounds - clear at anterior chest wall and clear at posterior chest wall. Adventitious sounds - No Adventitious  sounds.  Cardiovascular Auscultation Rhythm - Regular rate and rhythm. Heart Sounds - S1 WNL and S2 WNL. Murmurs & Other Heart Sounds - Auscultation of the heart reveals - No Murmurs.  Abdomen Palpation/Percussion Tenderness - Abdomen is non-tender to palpation. Rigidity (guarding) - Abdomen is soft. Auscultation Auscultation of the abdomen reveals - Bowel sounds normal.  Female Genitourinary Note: Not done, not pertinent to present illness   Musculoskeletal Note: Well-developed female alert and oriented in no apparent distress. RIGHT knee shows no effusion. There is marked crepitus on range of motion of the RIGHT knee. Range is approximate 5-120. There is no instability.   Assessment & Plan Primary osteoarthritis of right knee (M17.11) Current Plans Right Total Knee Replacement  Disposition: Home - Straight to outpatient therapy. Prescription for Physical Therapy given. Patient instructed to schedule therapy either through [facility] or their preferred physical therapy provider.  PCP: Dr. Daneil Dolin - pending  IV TXA  Anesthesia Issues: None  Patient was instructed on what medications to stop prior to surgery.  Signed electronically by Joelene Millin, III PA-C

## 2017-01-19 NOTE — Transfer of Care (Signed)
Immediate Anesthesia Transfer of Care Note  Patient: Mallory Hines  Procedure(s) Performed: Procedure(s) with comments: RIGHT TOTAL KNEE ARTHROPLASTY (Right) - with block  Patient Location: PACU  Anesthesia Type:Spinal and MAC combined with regional for post-op pain  Level of Consciousness:  sedated, patient cooperative and responds to stimulation  Airway & Oxygen Therapy:Patient Spontanous Breathing and Patient connected to face mask oxgen  Post-op Assessment:  Report given to PACU RN and Post -op Vital signs reviewed and stable  Post vital signs:  Reviewed and stable  Last Vitals:  Vitals:   01/19/17 0642 01/19/17 0952  BP: (!) 168/78 130/67  Pulse: 72 72  Resp: 16 18  Temp: 37 C 37.1 C  SpO2: 38% 466%    Complications: No apparent anesthesia complications

## 2017-01-19 NOTE — Anesthesia Procedure Notes (Addendum)
Spinal  Patient location during procedure: OR Start time: 01/19/2017 8:22 AM End time: 01/19/2017 8:26 AM Reason for block: at surgeon's request Staffing Resident/CRNA: Christell Faith L Performed: resident/CRNA  Preanesthetic Checklist Completed: patient identified, site marked, surgical consent, pre-op evaluation, timeout performed, IV checked, risks and benefits discussed and monitors and equipment checked Spinal Block Patient position: sitting Prep: DuraPrep Patient monitoring: heart rate, continuous pulse ox and blood pressure Approach: right paramedian Location: L3-4 Injection technique: single-shot Needle Needle type: Pencan  Needle gauge: 24 G Needle length: 9 cm Assessment Sensory level: T4 Additional Notes Expiration of kit checked and confirmed. Patient tolerated procedure well,without complications x 1 attempt with noted clear CSF. Loss of motor and sensory on exam post injection.

## 2017-01-19 NOTE — Op Note (Signed)
OPERATIVE REPORT-TOTAL KNEE ARTHROPLASTY   Pre-operative diagnosis- Osteoarthritis  Right knee(s)  Post-operative diagnosis- Osteoarthritis Right knee(s)  Procedure-  Right  Total Knee Arthroplasty  Surgeon- Dione Plover. Emila Steinhauser, MD  Assistant- Arlee Muslim, PA-C   Anesthesia-  Adductor canal block and spinal  EBL-* No blood loss amount entered *   Drains Hemovac  Tourniquet time-  Total Tourniquet Time Documented: Thigh (laterality) - 31 minutes Total: Thigh (laterality) - 31 minutes     Complications- None  Condition-PACU - hemodynamically stable.   Brief Clinical Note  Mallory Hines is a 66 y.o. year old female with end stage OA of her right knee with progressively worsening pain and dysfunction. She has constant pain, with activity and at rest and significant functional deficits with difficulties even with ADLs. She has had extensive non-op management including analgesics, injections of cortisone and viscosupplements, and home exercise program, but remains in significant pain with significant dysfunction.Radiographs show bone on bone arthritis medial and patellofemoral. She presents now for right Total Knee Arthroplasty.    Procedure in detail---   The patient is brought into the operating room and positioned supine on the operating table. After successful administration of  Adductor canal block and spinal,   a tourniquet is placed high on the  Right thigh(s) and the lower extremity is prepped and draped in the usual sterile fashion. Time out is performed by the operating team and then the  Right lower extremity is wrapped in Esmarch, knee flexed and the tourniquet inflated to 300 mmHg.       A midline incision is made with a ten blade through the subcutaneous tissue to the level of the extensor mechanism. A fresh blade is used to make a medial parapatellar arthrotomy. Soft tissue over the proximal medial tibia is subperiosteally elevated to the joint line with a knife and into  the semimembranosus bursa with a Cobb elevator. Soft tissue over the proximal lateral tibia is elevated with attention being paid to avoiding the patellar tendon on the tibial tubercle. The patella is everted, knee flexed 90 degrees and the ACL and PCL are removed. Findings are bone on bone medial and patellofemoral with large global osteophytes.        The drill is used to create a starting hole in the distal femur and the canal is thoroughly irrigated with sterile saline to remove the fatty contents. The 5 degree Right  valgus alignment guide is placed into the femoral canal and the distal femoral cutting block is pinned to remove 10 mm off the distal femur. Resection is made with an oscillating saw.      The tibia is subluxed forward and the menisci are removed. The extramedullary alignment guide is placed referencing proximally at the medial aspect of the tibial tubercle and distally along the second metatarsal axis and tibial crest. The block is pinned to remove 26mm off the more deficient medial  side. Resection is made with an oscillating saw. Size 2.5is the most appropriate size for the tibia and the proximal tibia is prepared with the modular drill and keel punch for that size.      The femoral sizing guide is placed and size 3 is most appropriate. Rotation is marked off the epicondylar axis and confirmed by creating a rectangular flexion gap at 90 degrees. The size 3 cutting block is pinned in this rotation and the anterior, posterior and chamfer cuts are made with the oscillating saw. The intercondylar block is then placed and that  cut is made.      Trial size 2.5 tibial component, trial size 3 posterior stabilized femur and a 12.5  mm posterior stabilized rotating platform insert trial is placed. Full extension is achieved with excellent varus/valgus and anterior/posterior balance throughout full range of motion. The patella is everted and thickness measured to be 22  mm. Free hand resection is taken  to 12 mm, a 35 template is placed, lug holes are drilled, trial patella is placed, and it tracks normally. Osteophytes are removed off the posterior femur with the trial in place. All trials are removed and the cut bone surfaces prepared with pulsatile lavage. Cement is mixed and once ready for implantation, the size 2.5 tibial implant, size  3 posterior stabilized femoral component, and the size 35 patella are cemented in place and the patella is held with the clamp. The trial insert is placed and the knee held in full extension. The Exparel (20 ml mixed with 60 ml saline) is injected into the extensor mechanism, posterior capsule, medial and lateral gutters and subcutaneous tissues.  All extruded cement is removed and once the cement is hard the permanent 12.5 mm posterior stabilized rotating platform insert is placed into the tibial tray.      The wound is copiously irrigated with saline solution and the extensor mechanism closed over a hemovac drain with #1 V-loc suture. The tourniquet is released for a total tourniquet time of 31  minutes. Flexion against gravity is 149 degrees and the patella tracks normally. Subcutaneous tissue is closed with 2.0 vicryl and subcuticular with running 4.0 Monocryl. The incision is cleaned and dried and steri-strips and a bulky sterile dressing are applied. The limb is placed into a knee immobilizer and the patient is awakened and transported to recovery in stable condition.      Please note that a surgical assistant was a medical necessity for this procedure in order to perform it in a safe and expeditious manner. Surgical assistant was necessary to retract the ligaments and vital neurovascular structures to prevent injury to them and also necessary for proper positioning of the limb to allow for anatomic placement of the prosthesis.   Dione Plover Chabely Norby, MD    01/19/2017, 9:18 AM

## 2017-01-19 NOTE — Interval H&P Note (Signed)
History and Physical Interval Note:  01/19/2017 6:32 AM  Mallory Hines  has presented today for surgery, with the diagnosis of Osteoarthritis Right Knee  The various methods of treatment have been discussed with the patient and family. After consideration of risks, benefits and other options for treatment, the patient has consented to  Procedure(s): RIGHT TOTAL KNEE ARTHROPLASTY (Right) as a surgical intervention .  The patient's history has been reviewed, patient examined, no change in status, stable for surgery.  I have reviewed the patient's chart and labs.  Questions were answered to the patient's satisfaction.     Gearlean Alf

## 2017-01-20 ENCOUNTER — Encounter (HOSPITAL_COMMUNITY): Payer: Self-pay | Admitting: *Deleted

## 2017-01-20 LAB — BASIC METABOLIC PANEL
ANION GAP: 6 (ref 5–15)
BUN: 17 mg/dL (ref 6–20)
CALCIUM: 8.6 mg/dL — AB (ref 8.9–10.3)
CO2: 27 mmol/L (ref 22–32)
Chloride: 103 mmol/L (ref 101–111)
Creatinine, Ser: 0.59 mg/dL (ref 0.44–1.00)
Glucose, Bld: 141 mg/dL — ABNORMAL HIGH (ref 65–99)
POTASSIUM: 4.3 mmol/L (ref 3.5–5.1)
Sodium: 136 mmol/L (ref 135–145)

## 2017-01-20 LAB — CBC
HCT: 34.9 % — ABNORMAL LOW (ref 36.0–46.0)
Hemoglobin: 11.6 g/dL — ABNORMAL LOW (ref 12.0–15.0)
MCH: 29.7 pg (ref 26.0–34.0)
MCHC: 33.2 g/dL (ref 30.0–36.0)
MCV: 89.5 fL (ref 78.0–100.0)
PLATELETS: 272 10*3/uL (ref 150–400)
RBC: 3.9 MIL/uL (ref 3.87–5.11)
RDW: 12.8 % (ref 11.5–15.5)
WBC: 12.5 10*3/uL — AB (ref 4.0–10.5)

## 2017-01-20 MED ORDER — RIVAROXABAN 10 MG PO TABS: 10.0000 mg | ORAL_TABLET | Freq: Every day | ORAL | 0 refills | Status: DC

## 2017-01-20 MED ORDER — TRAMADOL HCL 50 MG PO TABS: 50.0000 mg | ORAL_TABLET | Freq: Four times a day (QID) | ORAL | 0 refills | Status: DC | PRN

## 2017-01-20 MED ORDER — LIP MEDEX EX OINT
TOPICAL_OINTMENT | CUTANEOUS | Status: AC
  Administered 2017-01-20: 22:00:00
  Filled 2017-01-20: qty 7

## 2017-01-20 MED ORDER — HYDROMORPHONE HCL 2 MG PO TABS: 2.0000 mg | ORAL_TABLET | ORAL | 0 refills | Status: DC | PRN

## 2017-01-20 MED ORDER — METHOCARBAMOL 500 MG PO TABS: 500.0000 mg | ORAL_TABLET | Freq: Four times a day (QID) | ORAL | 0 refills | Status: DC | PRN

## 2017-01-20 MED ORDER — ALUM & MAG HYDROXIDE-SIMETH 200-200-20 MG/5ML PO SUSP: 30.0000 mL | ORAL | Status: DC | PRN

## 2017-01-20 NOTE — Progress Notes (Signed)
Discharge planning, no HH needs identified. Plan for OP PT, has DME. [phone] 

## 2017-01-20 NOTE — Evaluation (Signed)
Occupational Therapy Evaluation Patient Details Name: Mallory Hines MRN: [id number] DOB: [birth year] Today's Date: 01/20/2017    History of Present Illness right TKA   Clinical Impression   This 66 year old female was admitted for the above sx. She was limited this session by feeling hot when in bathroom. Will return tomorrow to further educate on shower transfer, reinforce safety with toilet transfer and increase standing tolerance during grooming tasks.    Follow Up Recommendations  Supervision/Assistance - 24 hour;No OT follow up    Equipment Recommendations  None recommended by OT    Recommendations for Other Services       Precautions / Restrictions Precautions Precautions: Fall;Knee Required Braces or Orthoses: Knee Immobilizer - Right Knee Immobilizer - Right: Discontinue once straight leg raise with < 10 degree lag Restrictions Weight Bearing Restrictions: No      Mobility Bed Mobility         Supine to sit: Min assist     General bed mobility comments: OOB with OT  Transfers Overall transfer level: Needs assistance Equipment used: Rolling walker (2 wheeled) Transfers: Sit to/from Stand Sit to Stand: Min guard         General transfer comment: cues for technique, right leg position    Balance                                           ADL either performed or assessed with clinical judgement   ADL Overall ADL's : Needs assistance/impaired Eating/Feeding: Independent   Grooming: Set up;Sitting   Upper Body Bathing: Set up;Sitting   Lower Body Bathing: Moderate assistance;Sit to/from stand   Upper Body Dressing : Set up;Sitting   Lower Body Dressing: Maximal assistance;Sit to/from stand   Toilet Transfer: Ambulation;BSC;RW;Min guard   Toileting- Water quality scientist and Hygiene: Min guard;Sit to/from stand         General ADL Comments: ambulated to bathroom and used comfort height commode; she has this at home (as well  as 3:1). Washed hands at sink and c/o feeling hot when she was about to brush teeth.  Transferred back to chair and adjusted room temp and gave her a warm cloth:  VSS  BP 163/75, HR 52, sats 96% on RA     Vision         Perception     Praxis      Pertinent Vitals/Pain Pain Score: 5  Pain Location: right knee Pain Descriptors / Indicators: Aching Pain Intervention(s): Limited activity within patient's tolerance;Monitored during session;Ice applied     Hand Dominance     Extremity/Trunk Assessment Upper Extremity Assessment Upper Extremity Assessment: Overall WFL for tasks assessed           Communication Communication Communication: No difficulties   Cognition Arousal/Alertness: Awake/alert Behavior During Therapy: WFL for tasks assessed/performed Overall Cognitive Status: Within Functional Limits for tasks assessed                                     General Comments       Exercises Total Joint Exercises Ankle Circles/Pumps: PROM;Both;10 reps Quad Sets: AROM;Both;10 reps   Shoulder Instructions      Home Living Family/patient expects to be discharged to:: Private residence Living Arrangements: Spouse/significant other Available Help at Discharge: Family  Bathroom Shower/Tub: Occupational psychologist: Handicapped height     Home Equipment: Shower seat - built in;Walker - 2 wheels;Cane - single point;Bedside commode   Additional Comments: sink next to commode      Prior Functioning/Environment Level of Independence: Independent                 OT Problem List: Decreased strength;Decreased activity tolerance;Decreased safety awareness;Decreased knowledge of use of DME or AE;Pain      OT Treatment/Interventions: Self-care/ADL training;DME and/or AE instruction;Patient/family education    OT Goals(Current goals can be found in the care plan section) Acute Rehab OT Goals Patient Stated Goal: to play OT  Goal Formulation: With patient Time For Goal Achievement: 01/27/17 Potential to Achieve Goals: Good ADL Goals Pt Will Perform Grooming: with supervision;standing Pt Will Transfer to Toilet: with supervision;ambulating (comfort height) Pt Will Perform Tub/Shower Transfer: Shower transfer;with min guard assist;ambulating;shower seat  OT Frequency: Min 2X/week   Barriers to D/C:            Co-evaluation              AM-PAC PT "6 Clicks" Daily Activity     Outcome Measure Help from another person eating meals?: None Help from another person taking care of personal grooming?: A Little Help from another person toileting, which includes using toliet, bedpan, or urinal?: A Little Help from another person bathing (including washing, rinsing, drying)?: A Lot Help from another person to put on and taking off regular upper body clothing?: A Little Help from another person to put on and taking off regular lower body clothing?: A Lot 6 Click Score: 17   End of Session CPM Right Knee CPM Right Knee: Off Nurse Communication: Other (comment)  Activity Tolerance: Other (comment) (feeling hot) Patient left: in chair;with call bell/phone within reach  OT Visit Diagnosis: Pain Pain - Right/Left: Right Pain - part of body: Knee                Time: 6644-0347 OT Time Calculation (min): 33 min Charges:  OT General Charges $OT Visit: 1 Visit OT Evaluation $OT Eval Low Complexity: 1 Low OT Treatments $Self Care/Home Management : 8-22 mins G-Codes:     Mallory Hines, Mallory Hines [phone] 01/20/2017  Mallory Hines 01/20/2017, 9:45 AM

## 2017-01-20 NOTE — Discharge Summary (Signed)
Physician Discharge Summary   Patient ID: Mallory Hines MRN: [id number] DOB/AGE: 07-05-1950 66 y.o.  Admit date: 01/19/2017 Discharge date: 01/21/2017  Primary Diagnosis:  Osteoarthritis  Right knee(s) Admission Diagnoses:  Past Medical History:  Diagnosis Date  . Diverticulosis of colon (without mention of hemorrhage)   . Hypertension   . Skin cancer [id number]   right calf, face  . Varicose veins    Discharge Diagnoses:   Principal Problem:   OA (osteoarthritis) of knee  Estimated body mass index is 33.29 kg/m as calculated from the following:   Height as of this encounter: _0  (1.575 m).   Weight as of this encounter: 82.6 kg (182 lb).  Procedure:  Procedure(s) (LRB): RIGHT TOTAL KNEE ARTHROPLASTY (Right)   Consults: None  HPI: Mallory Hines is a 66 y.o. year old female with end stage OA of her right knee with progressively worsening pain and dysfunction. She has constant pain, with activity and at rest and significant functional deficits with difficulties even with ADLs. She has had extensive non-op management including analgesics, injections of cortisone and viscosupplements, and home exercise program, but remains in significant pain with significant dysfunction.Radiographs show bone on bone arthritis medial and patellofemoral. She presents now for right Total Knee Arthroplasty.    Laboratory Data: Admission on 01/19/2017  Component Date Value Ref Range Status  . WBC 01/20/2017 12.5* 4.0 - 10.5 K/uL Final  . RBC 01/20/2017 3.90  3.87 - 5.11 MIL/uL Final  . Hemoglobin 01/20/2017 11.6* 12.0 - 15.0 g/dL Final  . HCT 01/20/2017 34.9* 36.0 - 46.0 % Final  . MCV 01/20/2017 89.5  78.0 - 100.0 fL Final  . MCH 01/20/2017 29.7  26.0 - 34.0 pg Final  . MCHC 01/20/2017 33.2  30.0 - 36.0 g/dL Final  . RDW 01/20/2017 12.8  11.5 - 15.5 % Final  . Platelets 01/20/2017 272  150 - 400 K/uL Final  . Sodium 01/20/2017 136  135 - 145 mmol/L Final  . Potassium 01/20/2017 4.3  3.5 - 5.1  mmol/L Final  . Chloride 01/20/2017 103  101 - 111 mmol/L Final  . CO2 01/20/2017 27  22 - 32 mmol/L Final  . Glucose, Bld 01/20/2017 141* 65 - 99 mg/dL Final  . BUN 01/20/2017 17  6 - 20 mg/dL Final  . Creatinine, Ser 01/20/2017 0.59  0.44 - 1.00 mg/dL Final  . Calcium 01/20/2017 8.6* 8.9 - 10.3 mg/dL Final  . GFR calc non Af Amer 01/20/2017 >60  >60 mL/min Final  . GFR calc Af Amer 01/20/2017 >60  >60 mL/min Final   Comment: (NOTE) The eGFR has been calculated using the CKD EPI equation. This calculation has not been validated in all clinical situations. eGFR's persistently <60 mL/min signify possible Chronic Kidney Disease.   Georgiann Hahn gap 01/20/2017 6  5 - 15 Final  Hospital Outpatient Visit on 01/12/2017  Component Date Value Ref Range Status  . aPTT 01/12/2017 29  24 - 36 seconds Final  . WBC 01/12/2017 4.9  4.0 - 10.5 K/uL Final  . RBC 01/12/2017 4.37  3.87 - 5.11 MIL/uL Final  . Hemoglobin 01/12/2017 13.0  12.0 - 15.0 g/dL Final  . HCT 01/12/2017 38.9  36.0 - 46.0 % Final  . MCV 01/12/2017 89.0  78.0 - 100.0 fL Final  . MCH 01/12/2017 29.7  26.0 - 34.0 pg Final  . MCHC 01/12/2017 33.4  30.0 - 36.0 g/dL Final  . RDW 01/12/2017 12.6  11.5 - 15.5 % Final  .  Platelets 01/12/2017 271  150 - 400 K/uL Final  . Sodium 01/12/2017 141  135 - 145 mmol/L Final  . Potassium 01/12/2017 4.3  3.5 - 5.1 mmol/L Final  . Chloride 01/12/2017 106  101 - 111 mmol/L Final  . CO2 01/12/2017 28  22 - 32 mmol/L Final  . Glucose, Bld 01/12/2017 156* 65 - 99 mg/dL Final  . BUN 01/12/2017 18  6 - 20 mg/dL Final  . Creatinine, Ser 01/12/2017 0.63  0.44 - 1.00 mg/dL Final  . Calcium 01/12/2017 9.2  8.9 - 10.3 mg/dL Final  . Total Protein 01/12/2017 7.4  6.5 - 8.1 g/dL Final  . Albumin 01/12/2017 4.3  3.5 - 5.0 g/dL Final  . AST 01/12/2017 25  15 - 41 U/L Final  . ALT 01/12/2017 23  14 - 54 U/L Final  . Alkaline Phosphatase 01/12/2017 66  38 - 126 U/L Final  . Total Bilirubin 01/12/2017 0.8  0.3 - 1.2  mg/dL Final  . GFR calc non Af Amer 01/12/2017 >60  >60 mL/min Final  . GFR calc Af Amer 01/12/2017 >60  >60 mL/min Final   Comment: (NOTE) The eGFR has been calculated using the CKD EPI equation. This calculation has not been validated in all clinical situations. eGFR's persistently <60 mL/min signify possible Chronic Kidney Disease.   . Anion gap 01/12/2017 7  5 - 15 Final  . Prothrombin Time 01/12/2017 13.6  11.4 - 15.2 seconds Final  . INR 01/12/2017 1.05   Final  . ABO/RH(D) 01/12/2017 AB POS   Final  . Antibody Screen 01/12/2017 NEG   Final  . Sample Expiration 01/12/2017 01/22/2017   Final  . Extend sample reason 01/12/2017 NO TRANSFUSIONS OR PREGNANCY IN THE PAST 3 MONTHS   Final  . MRSA, PCR 01/12/2017 NEGATIVE  NEGATIVE Final  . Staphylococcus aureus 01/12/2017 POSITIVE* NEGATIVE Final   Comment: (NOTE) The Xpert SA Assay (FDA approved for NASAL specimens in patients 70 years of age and older), is one component of a comprehensive surveillance program. It is not intended to diagnose infection nor to guide or monitor treatment.   . ABO/RH(D) 01/12/2017 AB POS   Final     X-Rays:No results found.  EKG: Orders placed or performed during the hospital encounter of 07/08/16  . ED EKG  . ED EKG  . EKG 12-Lead  . EKG 12-Lead  . EKG     Hospital Course: TERE MCCONAUGHEY is a 66 y.o. who was admitted to [facility]. They were brought to the operating room on 01/19/2017 and underwent Procedure(s): RIGHT TOTAL KNEE ARTHROPLASTY.  Patient tolerated the procedure well and was later transferred to the recovery room and then to the orthopaedic floor for postoperative care.  They were given PO and IV analgesics for pain control following their surgery.  They were given 24 hours of postoperative antibiotics of  Anti-infectives    Start     Dose/Rate Route Frequency Ordered Stop   01/19/17 1400  ceFAZolin (ANCEF) IVPB 2g/100 mL premix     2 g 200 mL/hr over 30 Minutes  Intravenous Every 6 hours 01/19/17 1201 01/19/17 2153   01/19/17 0714  ceFAZolin (ANCEF) 2-4 GM/100ML-% IVPB    Comments:  Christell Faith   : cabinet override      01/19/17 0714 01/19/17 0828   01/19/17 0641  ceFAZolin (ANCEF) IVPB 2g/100 mL premix     2 g 200 mL/hr over 30 Minutes Intravenous On call to O.R. 01/19/17 1610 01/19/17  0858     and started on DVT prophylaxis in the form of Xarelto.   PT and OT were ordered for total joint protocol.  Discharge planning consulted to help with postop disposition and equipment needs.  Patient had a decent night on the evening of surgery.  They started to get up OOB with therapy on day one. Hemovac drain was pulled without difficulty.  Continued to work with therapy into day two.  Dressing was changed on day two and the incision was healing well.  Patient was seen in rounds on day two and was ready to go home.  Diet - Cardiac diet Follow up - in 2 weeks Activity - WBAT Disposition - Home Condition Upon Discharge - Improving D/C Meds - See DC Summary DVT Prophylaxis - Xarelto   Discharge Instructions    Call MD / Call 911    Complete by:  As directed    If you experience chest pain or shortness of breath, CALL 911 and be transported to the hospital emergency room.  If you develope a fever above 101 F, pus (white drainage) or increased drainage or redness at the wound, or calf pain, call your surgeon's office.   Change dressing    Complete by:  As directed    Change dressing daily with sterile 4 x 4 inch gauze dressing and apply TED hose. Do not submerge the incision under water.   Constipation Prevention    Complete by:  As directed    Drink plenty of fluids.  Prune juice may be helpful.  You may use a stool softener, such as Colace (over the counter) 100 mg twice a day.  Use MiraLax (over the counter) for constipation as needed.   Diet - low sodium heart healthy    Complete by:  As directed    Discharge instructions    Complete by:  As  directed    Take Xarelto for two and a half more weeks, then discontinue Xarelto. Once the patient has completed the blood thinner regimen, then take a Baby 81 mg Aspirin daily for three more weeks.   Pick up stool softner and laxative for home use following surgery while on pain medications. Do not submerge incision under water. Please use good hand washing techniques while changing dressing each day. May shower starting three days after surgery. Please use a clean towel to pat the incision dry following showers. Continue to use ice for pain and swelling after surgery. Do not use any lotions or creams on the incision until instructed by your surgeon.  Wear both TED hose on both legs during the day every day for three weeks, but may remove the TED hose at night at home.  Postoperative Constipation Protocol  Constipation - defined medically as fewer than three stools per week and severe constipation as less than one stool per week.  One of the most common issues patients have following surgery is constipation.  Even if you have a regular bowel pattern at home, your normal regimen is likely to be disrupted due to multiple reasons following surgery.  Combination of anesthesia, postoperative narcotics, change in appetite and fluid intake all can affect your bowels.  In order to avoid complications following surgery, here are some recommendations in order to help you during your recovery period.  Colace (docusate) - Pick up an over-the-counter form of Colace or another stool softener and take twice a day as long as you are requiring postoperative pain medications.  Take with a  full glass of water daily.  If you experience loose stools or diarrhea, hold the colace until you stool forms back up.  If your symptoms do not get better within 1 week or if they get worse, check with your doctor.  Dulcolax (bisacodyl) - Pick up over-the-counter and take as directed by the product packaging as needed to  assist with the movement of your bowels.  Take with a full glass of water.  Use this product as needed if not relieved by Colace only.   MiraLax (polyethylene glycol) - Pick up over-the-counter to have on hand.  MiraLax is a solution that will increase the amount of water in your bowels to assist with bowel movements.  Take as directed and can mix with a glass of water, juice, soda, coffee, or tea.  Take if you go more than two days without a movement. Do not use MiraLax more than once per day. Call your doctor if you are still constipated or irregular after using this medication for 7 days in a row.  If you continue to have problems with postoperative constipation, please contact the office for further assistance and recommendations.  If you experience "the worst abdominal pain ever" or develop nausea or vomiting, please contact the office immediatly for further recommendations for treatment.   Do not put a pillow under the knee. Place it under the heel.    Complete by:  As directed    Do not sit on low chairs, stoools or toilet seats, as it may be difficult to get up from low surfaces    Complete by:  As directed    Driving restrictions    Complete by:  As directed    No driving until released by the physician.   Increase activity slowly as tolerated    Complete by:  As directed    Lifting restrictions    Complete by:  As directed    No lifting until released by the physician.   Patient may shower    Complete by:  As directed    You may shower without a dressing once there is no drainage.  Do not wash over the wound.  If drainage remains, do not shower until drainage stops.   TED hose    Complete by:  As directed    Use stockings (TED hose) for 3 weeks on both leg(s).  You may remove them at night for sleeping.   Weight bearing as tolerated    Complete by:  As directed    Laterality:  right   Extremity:  Lower     Allergies as of 01/20/2017      Reactions   Amlodipine Other (See  Comments)   Leg cramps   Celecoxib Other (See Comments)   REACTION: whelps   Codeine Nausea And Vomiting   REACTION: does not like drug   Meloxicam Other (See Comments)   REACTION: whelps   Nsaids Other (See Comments)   REACTION: whelps   Adhesive [tape] Rash      Medication List    TAKE these medications   amLODipine 10 MG tablet Commonly known as:  NORVASC TAKE 1 TABLET (10 MG TOTAL) BY MOUTH DAILY.   benazepril 20 MG tablet Commonly known as:  LOTENSIN TAKE 1 TABLET (20 MG TOTAL) BY MOUTH DAILY. OVERDUE FOR FOLLOW-UP APPT MUST SEE PROVIDER FOR REFILLS   diphenhydrAMINE 25 mg capsule Commonly known as:  BENADRYL Take 25 mg by mouth daily as needed for itching or allergies.  HYDROmorphone 2 MG tablet Commonly known as:  DILAUDID Take 1-2 tablets (2-4 mg total) by mouth every 4 (four) hours as needed for severe pain.   loratadine 10 MG tablet Commonly known as:  CLARITIN Take 10 mg by mouth daily as needed for allergies.   methocarbamol 500 MG tablet Commonly known as:  ROBAXIN Take 1 tablet (500 mg total) by mouth every 6 (six) hours as needed for muscle spasms.   mometasone 0.1 % cream Commonly known as:  ELOCON Apply 1 application topically at bedtime.   rivaroxaban 10 MG Tabs tablet Commonly known as:  XARELTO Take 1 tablet (10 mg total) by mouth daily with breakfast. Take Xarelto for two and a half more weeks following discharge from the hospital, then discontinue Xarelto. Once the patient has completed the blood thinner regimen, then take a Baby 81 mg Aspirin daily for three more weeks.   traMADol 50 MG tablet Commonly known as:  ULTRAM Take 1-2 tablets (50-100 mg total) by mouth every 6 (six) hours as needed for moderate pain.            Discharge Care Instructions        Start     Ordered   01/21/17 0000  rivaroxaban (XARELTO) 10 MG TABS tablet  Daily with breakfast    Question:  Supervising Provider  Answer:  Gaynelle Arabian   01/20/17 2301    01/20/17 0000  HYDROmorphone (DILAUDID) 2 MG tablet  Every 4 hours PRN    Question:  Supervising Provider  Answer:  Gaynelle Arabian   01/20/17 2301   01/20/17 0000  methocarbamol (ROBAXIN) 500 MG tablet  Every 6 hours PRN    Question:  Supervising Provider  Answer:  Gaynelle Arabian   01/20/17 2301   01/20/17 0000  traMADol (ULTRAM) 50 MG tablet  Every 6 hours PRN    Question:  Supervising Provider  Answer:  Gaynelle Arabian   01/20/17 2301   01/20/17 0000  Call MD / Call 911    Comments:  If you experience chest pain or shortness of breath, CALL 911 and be transported to the hospital emergency room.  If you develope a fever above 101 F, pus (white drainage) or increased drainage or redness at the wound, or calf pain, call your surgeon's office.   01/20/17 2301   01/20/17 0000  Discharge instructions    Comments:  Take Xarelto for two and a half more weeks, then discontinue Xarelto. Once the patient has completed the blood thinner regimen, then take a Baby 81 mg Aspirin daily for three more weeks.   Pick up stool softner and laxative for home use following surgery while on pain medications. Do not submerge incision under water. Please use good hand washing techniques while changing dressing each day. May shower starting three days after surgery. Please use a clean towel to pat the incision dry following showers. Continue to use ice for pain and swelling after surgery. Do not use any lotions or creams on the incision until instructed by your surgeon.  Wear both TED hose on both legs during the day every day for three weeks, but may remove the TED hose at night at home.  Postoperative Constipation Protocol  Constipation - defined medically as fewer than three stools per week and severe constipation as less than one stool per week.  One of the most common issues patients have following surgery is constipation.  Even if you have a regular bowel pattern at home, your normal regimen  is likely to  be disrupted due to multiple reasons following surgery.  Combination of anesthesia, postoperative narcotics, change in appetite and fluid intake all can affect your bowels.  In order to avoid complications following surgery, here are some recommendations in order to help you during your recovery period.  Colace (docusate) - Pick up an over-the-counter form of Colace or another stool softener and take twice a day as long as you are requiring postoperative pain medications.  Take with a full glass of water daily.  If you experience loose stools or diarrhea, hold the colace until you stool forms back up.  If your symptoms do not get better within 1 week or if they get worse, check with your doctor.  Dulcolax (bisacodyl) - Pick up over-the-counter and take as directed by the product packaging as needed to assist with the movement of your bowels.  Take with a full glass of water.  Use this product as needed if not relieved by Colace only.   MiraLax (polyethylene glycol) - Pick up over-the-counter to have on hand.  MiraLax is a solution that will increase the amount of water in your bowels to assist with bowel movements.  Take as directed and can mix with a glass of water, juice, soda, coffee, or tea.  Take if you go more than two days without a movement. Do not use MiraLax more than once per day. Call your doctor if you are still constipated or irregular after using this medication for 7 days in a row.  If you continue to have problems with postoperative constipation, please contact the office for further assistance and recommendations.  If you experience "the worst abdominal pain ever" or develop nausea or vomiting, please contact the office immediatly for further recommendations for treatment.   01/20/17 2301   01/20/17 0000  Diet - low sodium heart healthy     01/20/17 2301   01/20/17 0000  Constipation Prevention    Comments:  Drink plenty of fluids.  Prune juice may be helpful.  You may use a stool  softener, such as Colace (over the counter) 100 mg twice a day.  Use MiraLax (over the counter) for constipation as needed.   01/20/17 2301   01/20/17 0000  Increase activity slowly as tolerated     01/20/17 2301   01/20/17 0000  Patient may shower    Comments:  You may shower without a dressing once there is no drainage.  Do not wash over the wound.  If drainage remains, do not shower until drainage stops.   01/20/17 2301   01/20/17 0000  Weight bearing as tolerated    Question Answer Comment  Laterality right   Extremity Lower      01/20/17 2301   01/20/17 0000  Driving restrictions    Comments:  No driving until released by the physician.   01/20/17 2301   01/20/17 0000  Lifting restrictions    Comments:  No lifting until released by the physician.   01/20/17 2301   01/20/17 0000  TED hose    Comments:  Use stockings (TED hose) for 3 weeks on both leg(s).  You may remove them at night for sleeping.   01/20/17 2301   01/20/17 0000  Change dressing    Comments:  Change dressing daily with sterile 4 x 4 inch gauze dressing and apply TED hose. Do not submerge the incision under water.   01/20/17 2301   01/20/17 0000  Do not put a pillow under  the knee. Place it under the heel.     01/20/17 2301   01/20/17 0000  Do not sit on low chairs, stoools or toilet seats, as it may be difficult to get up from low surfaces     01/20/17 2301     Follow-up Information    Gaynelle Arabian, MD. Schedule an appointment as soon as possible for a visit on 02/03/2017.   Specialty:  Orthopedic Surgery Contact information: [address] [place] [address] [phone]           Signed: Arlee Muslim, PA-C Orthopaedic Surgery 01/20/2017, 11:02 PM

## 2017-01-20 NOTE — Progress Notes (Signed)
Physical Therapy Treatment Patient Details Name: Mallory Hines MRN: [id number] DOB: 05-05-1951 Today's Date: 01/20/2017    History of Present Illness right TKA    PT Comments    Pt progressing well; less dizziness and no nausea this pm (did not have Dilaudid--only Tramadol); will see in am, continue POC  Follow Up Recommendations  DC plan and follow up therapy as arranged by surgeon;Outpatient PT     Equipment Recommendations  None recommended by PT    Recommendations for Other Services       Precautions / Restrictions Precautions Precautions: Fall;Knee Required Braces or Orthoses: Knee Immobilizer - Right Knee Immobilizer - Right: Discontinue once straight leg raise with < 10 degree lag Restrictions Weight Bearing Restrictions: No Other Position/Activity Restrictions: WBAT    Mobility  Bed Mobility Overal bed mobility: Needs Assistance Bed Mobility: Sit to Supine       Sit to supine: Min guard;Supervision   General bed mobility comments: cues to self assist  Transfers Overall transfer level: Needs assistance Equipment used: Rolling walker (2 wheeled) Transfers: Sit to/from Stand Sit to Stand: Min guard         General transfer comment: cues for technique, right leg position  Ambulation/Gait Ambulation/Gait assistance: Min assist Ambulation Distance (Feet): 30 Feet Assistive device: Rolling walker (2 wheeled) Gait Pattern/deviations: Step-to pattern;Decreased stance time - right;Antalgic     General Gait Details: cues for sequence and RW distance from self   Stairs            Wheelchair Mobility    Modified Rankin (Stroke Patients Only)       Balance                                            Cognition Arousal/Alertness: Awake/alert Behavior During Therapy: WFL for tasks assessed/performed Overall Cognitive Status: Within Functional Limits for tasks assessed                                         Exercises Total Joint Exercises Ankle Circles/Pumps: PROM;Both;10 reps Quad Sets: AROM;Both;10 reps Heel Slides: AAROM;Right;10 reps Hip ABduction/ADduction: AAROM;Right;10 reps;AROM Straight Leg Raises: AROM;AAROM;Strengthening;10 reps;Right    General Comments        Pertinent Vitals/Pain Pain Score: 5  Pain Location: right knee Pain Descriptors / Indicators: Aching Pain Intervention(s): Limited activity within patient's tolerance;Monitored during session;Repositioned    Home Living                      Prior Function            PT Goals (current goals can now be found in the care plan section) Acute Rehab PT Goals Patient Stated Goal: to play PT Goal Formulation: With patient/family Time For Goal Achievement: 01/24/17 Potential to Achieve Goals: Good Progress towards PT goals: Progressing toward goals    Frequency    7X/week      PT Plan Current plan remains appropriate    Co-evaluation              AM-PAC PT "6 Clicks" Daily Activity  Outcome Measure  Difficulty turning over in bed (including adjusting bedclothes, sheets and blankets)?: A Little Difficulty moving from lying on back to sitting on the side of the bed? : A  Little Difficulty sitting down on and standing up from a chair with arms (e.g., wheelchair, bedside commode, etc,.)?: A Little Help needed moving to and from a bed to chair (including a wheelchair)?: A Little Help needed walking in hospital room?: A Little Help needed climbing 3-5 steps with a railing? : A Little 6 Click Score: 18    End of Session Equipment Utilized During Treatment: Gait belt;Right knee immobilizer Activity Tolerance: Patient tolerated treatment well Patient left: in bed;with call bell/phone within reach Nurse Communication: Mobility status PT Visit Diagnosis: Difficulty in walking, not elsewhere classified (R26.2);Pain Pain - Right/Left: Right Pain - part of body: Knee     Time: 2257-5051 PT  Time Calculation (min) (ACUTE ONLY): 28 min  Charges:  $Gait Training: 8-22 mins $Therapeutic Exercise: 8-22 mins                    G Codes:          Nahomy Limburg Feb 13, 2017, 2:06 PM

## 2017-01-20 NOTE — Progress Notes (Signed)
Physical Therapy Treatment Patient Details Name: Mallory Hines MRN: [id number] DOB: 02-26-1951 Today's Date: 01/20/2017    History of Present Illness right TKA    PT Comments    Progressing well although still with c/o feeling hot, dizzy, nauseous with mobility;    Follow Up Recommendations  DC plan and follow up therapy as arranged by surgeon;Outpatient PT     Equipment Recommendations  None recommended by PT    Recommendations for Other Services       Precautions / Restrictions Precautions Precautions: Fall;Knee Required Braces or Orthoses: Knee Immobilizer - Right Knee Immobilizer - Right: Discontinue once straight leg raise with < 10 degree lag Restrictions Weight Bearing Restrictions: No    Mobility  Bed Mobility         Supine to sit: (P) Min assist     General bed mobility comments: OOB with OT  Transfers Overall transfer level: Needs assistance Equipment used: Rolling walker (2 wheeled) Transfers: Sit to/from Stand Sit to Stand: Min guard         General transfer comment: cues for technique, right leg position  Ambulation/Gait Ambulation/Gait assistance: Min assist;+2 safety/equipment;Min guard Ambulation Distance (Feet): 60 Feet Assistive device: Rolling walker (2 wheeled) Gait Pattern/deviations: Step-to pattern;Decreased stance time - right;Antalgic     General Gait Details: cues for sequence, complained of feeling hot, nauseous and  dizzy. same during OT session with stable VS; RN aware   Stairs            Wheelchair Mobility    Modified Rankin (Stroke Patients Only)       Balance                                            Cognition Arousal/Alertness: Awake/alert Behavior During Therapy: WFL for tasks assessed/performed Overall Cognitive Status: Within Functional Limits for tasks assessed                                        Exercises Total Joint Exercises Ankle Circles/Pumps:  PROM;Both;10 reps Quad Sets: AROM;Both;10 reps    General Comments        Pertinent Vitals/Pain Pain Score: 5  Pain Location: right knee Pain Descriptors / Indicators: Aching Pain Intervention(s): Limited activity within patient's tolerance;Monitored during session;Ice applied    Home Living Family/patient expects to be discharged to:: Private residence Living Arrangements: Spouse/significant other Available Help at Discharge: Family         Home Equipment: Shower seat - built in;Walker - 2 wheels;Cane - single point;Bedside commode Additional Comments: sink next to commode    Prior Function Level of Independence: Independent          PT Goals (current goals can now be found in the care plan section) Acute Rehab PT Goals PT Goal Formulation: With patient/family Time For Goal Achievement: 01/24/17 Potential to Achieve Goals: Good Progress towards PT goals: Progressing toward goals    Frequency    7X/week      PT Plan Current plan remains appropriate    Co-evaluation              AM-PAC PT "6 Clicks" Daily Activity  Outcome Measure  Difficulty turning over in bed (including adjusting bedclothes, sheets and blankets)?: Unable Difficulty moving from lying on back to sitting  on the side of the bed? : Unable Difficulty sitting down on and standing up from a chair with arms (e.g., wheelchair, bedside commode, etc,.)?: A Little Help needed moving to and from a bed to chair (including a wheelchair)?: A Little Help needed walking in hospital room?: A Little Help needed climbing 3-5 steps with a railing? : A Little 6 Click Score: 14    End of Session Equipment Utilized During Treatment: Gait belt;Right knee immobilizer Activity Tolerance: Patient tolerated treatment well Patient left: in chair;with call bell/phone within reach;with nursing/sitter in room Nurse Communication: Mobility status PT Visit Diagnosis: Difficulty in walking, not elsewhere classified  (R26.2);Pain Pain - Right/Left: Right Pain - part of body: Knee     Time: 7340-3709 PT Time Calculation (min) (ACUTE ONLY): 12 min  Charges:  $Gait Training: 8-22 mins                    G Codes:          Sumi Lye 01/28/17, 9:44 AM

## 2017-01-20 NOTE — Progress Notes (Signed)
   Subjective: 1 Day Post-Op Procedure(s) (LRB): RIGHT TOTAL KNEE ARTHROPLASTY (Right) Patient reports pain as mild.   Patient seen in rounds for Dr. Wynelle Link. Doing well this morning. Patient is well, and has had no acute complaints or problems We will resume therapy today.   She walked about 30 feet yesterday. Plan is to go Home after hospital stay.  Objective: Vital signs in last 24 hours: Temp:  [97.4 F (36.3 C)-98.7 F (37.1 C)] 98.3 F (36.8 C) (09/18 0609) Pulse Rate:  [50-72] 61 (09/18 0609) Resp:  [12-18] 16 (09/18 0609) BP: (117-165)/(61-87) 130/71 (09/18 0609) SpO2:  [98 %-100 %] 100 % (09/18 0609) Weight:  [82.6 kg (182 lb)] 82.6 kg (182 lb) (09/17 1145)  Intake/Output from previous day:  Intake/Output Summary (Last 24 hours) at 01/20/17 0909 Last data filed at 01/20/17 0600  Gross per 24 hour  Intake             3490 ml  Output           2402.5 ml  Net           1087.5 ml    Intake/Output this shift: No intake/output data recorded.  Labs:  Recent Labs  01/20/17 0551  HGB 11.6*    Recent Labs  01/20/17 0551  WBC 12.5*  RBC 3.90  HCT 34.9*  PLT 272    Recent Labs  01/20/17 0551  NA 136  K 4.3  CL 103  CO2 27  BUN 17  CREATININE 0.59  GLUCOSE 141*  CALCIUM 8.6*   No results for input(s): LABPT, INR in the last 72 hours.  EXAM General - Patient is Alert, Appropriate and Oriented Extremity - Neurovascular intact Sensation intact distally Intact pulses distally Dorsiflexion/Plantar flexion intact Dressing - dressing C/D/I Motor Function - intact, moving foot and toes well on exam.  Hemovac pulled without difficulty.  Past Medical History:  Diagnosis Date  . Diverticulosis of colon (without mention of hemorrhage)   . Hypertension   . Skin cancer [id number]   right calf, face  . Varicose veins     Assessment/Plan: 1 Day Post-Op Procedure(s) (LRB): RIGHT TOTAL KNEE ARTHROPLASTY (Right) Principal Problem:   OA (osteoarthritis)  of knee  Estimated body mass index is 33.29 kg/m as calculated from the following:   Height as of this encounter: 5\' 2"  (1.575 m).   Weight as of this encounter: 82.6 kg (182 lb). Up with therapy Plan for discharge tomorrow  DVT Prophylaxis - Xarelto Weight-Bearing as tolerated to right leg D/C O2 and Pulse OX and try on Room Air  Arlee Muslim, PA-C Orthopaedic Surgery 01/20/2017, 9:09 AM

## 2017-01-21 LAB — BASIC METABOLIC PANEL
Anion gap: 9 (ref 5–15)
BUN: 14 mg/dL (ref 6–20)
CALCIUM: 8.8 mg/dL — AB (ref 8.9–10.3)
CHLORIDE: 99 mmol/L — AB (ref 101–111)
CO2: 28 mmol/L (ref 22–32)
Creatinine, Ser: 0.57 mg/dL (ref 0.44–1.00)
GFR calc Af Amer: >60 mL/min (ref >60)
GLUCOSE: 128 mg/dL — AB (ref 65–99)
Potassium: 4 mmol/L (ref 3.5–5.1)
Sodium: 136 mmol/L (ref 135–145)

## 2017-01-21 LAB — CBC
HCT: 37.6 % (ref 36.0–46.0)
HEMOGLOBIN: 12.3 g/dL (ref 12.0–15.0)
MCH: 29.3 pg (ref 26.0–34.0)
MCHC: 32.7 g/dL (ref 30.0–36.0)
MCV: 89.5 fL (ref 78.0–100.0)
Platelets: 274 10*3/uL (ref 150–400)
RBC: 4.2 MIL/uL (ref 3.87–5.11)
RDW: 12.7 % (ref 11.5–15.5)
WBC: 12.3 10*3/uL — ABNORMAL HIGH (ref 4.0–10.5)

## 2017-01-21 NOTE — Progress Notes (Signed)
   Subjective: 2 Days Post-Op Procedure(s) (LRB): RIGHT TOTAL KNEE ARTHROPLASTY (Right) Patient reports pain as mild.   Patient seen in rounds with Dr. Wynelle Link. Patient is well, but has had some minor complaints of pain in the knee, requiring pain medications Patient is ready to go home  Objective: Vital signs in last 24 hours: Temp:  [97.4 F (36.3 C)-98.3 F (36.8 C)] 98.3 F (36.8 C) (09/19 0443) Pulse Rate:  [53-69] 69 (09/19 0443) Resp:  [15-18] 16 (09/19 0443) BP: (142-163)/(62-78) 163/78 (09/19 0443) SpO2:  [95 %-98 %] 97 % (09/19 0443)  Intake/Output from previous day:  Intake/Output Summary (Last 24 hours) at 01/21/17 0743 Last data filed at 01/21/17 0444  Gross per 24 hour  Intake             1220 ml  Output              450 ml  Net              770 ml    Intake/Output this shift: No intake/output data recorded.  Labs:  Recent Labs  01/20/17 0551 01/21/17 0536  HGB 11.6* 12.3    Recent Labs  01/20/17 0551 01/21/17 0536  WBC 12.5* 12.3*  RBC 3.90 4.20  HCT 34.9* 37.6  PLT 272 274    Recent Labs  01/20/17 0551 01/21/17 0536  NA 136 136  K 4.3 4.0  CL 103 99*  CO2 27 28  BUN 17 14  CREATININE 0.59 0.57  GLUCOSE 141* 128*  CALCIUM 8.6* 8.8*   No results for input(s): LABPT, INR in the last 72 hours.  EXAM: General - Patient is Alert, Appropriate and Oriented Extremity - Neurovascular intact Sensation intact distally Intact pulses distally Incision - clean, dry, no drainage Motor Function - intact, moving foot and toes well on exam.   Assessment/Plan: 2 Days Post-Op Procedure(s) (LRB): RIGHT TOTAL KNEE ARTHROPLASTY (Right) Procedure(s) (LRB): RIGHT TOTAL KNEE ARTHROPLASTY (Right) Past Medical History:  Diagnosis Date  . Diverticulosis of colon (without mention of hemorrhage)   . Hypertension   . Skin cancer [id number]   right calf, face  . Varicose veins    Principal Problem:   OA (osteoarthritis) of knee  Estimated body  mass index is 33.29 kg/m as calculated from the following:   Height as of this encounter: 5\' 2"  (1.575 m).   Weight as of this encounter: 82.6 kg (182 lb). Up with therapy Diet - Cardiac diet Follow up - in 2 weeks Activity - WBAT Disposition - Home Condition Upon Discharge - Improving D/C Meds - See DC Summary DVT Prophylaxis - [place], PA-C Orthopaedic Surgery 01/21/2017, 7:43 AM

## 2017-01-21 NOTE — Progress Notes (Signed)
Occupational Therapy Treatment Patient Details Name: MIKEYA TOMASETTI MRN: [id number] DOB: [birth year] Today's Date: 01/21/2017    History of present illness right TKA   OT comments  Would benefit from using KI today.  Became overheated in bathroom  Follow Up Recommendations  Supervision/Assistance - 24 hour;No OT follow up    Equipment Recommendations  None recommended by OT    Recommendations for Other Services      Precautions / Restrictions Precautions Precautions: Fall;Knee Precaution Comments: pt was in bathroom without KI; would benefit from it for increased support.  Restrictions Weight Bearing Restrictions: No Other Position/Activity Restrictions: WBAT       Mobility Bed Mobility               General bed mobility comments: oob with NT  Transfers   Equipment used: Rolling walker (2 wheeled)   Sit to Stand: Supervision         General transfer comment: cues for UE/LE placement    Balance                                           ADL either performed or assessed with clinical judgement   ADL       Grooming: Wash/dry hands;Supervision/safety;Standing                   Toilet Transfer: Min guard;Ambulation;BSC;RW   Toileting- Clothing Manipulation and Hygiene: Supervision/safety;Sit to/from stand         General ADL Comments: attempted shower transfer:  pt was unable to clear strong leg over ledge and she got hot again.  Returned to chair.  Handout given and pt verbalizes understanding.  Placed KI next to her to use during next session     Vision       Perception     Praxis      Cognition Arousal/Alertness: Awake/alert Behavior During Therapy: WFL for tasks assessed/performed Overall Cognitive Status: Within Functional Limits for tasks assessed                                          Exercises     Shoulder Instructions       General Comments      Pertinent Vitals/ Pain       Pain  Score: 6  Pain Location: right knee Pain Descriptors / Indicators: Aching Pain Intervention(s): Limited activity within patient's tolerance;Monitored during session;Premedicated before session;Repositioned;Ice applied  Home Living                                          Prior Functioning/Environment              Frequency  Min 2X/week        Progress Toward Goals  OT Goals(current goals can now be found in the care plan section)  Progress towards OT goals: Progressing toward goals     Plan      Co-evaluation                 AM-PAC PT "6 Clicks" Daily Activity     Outcome Measure   Help from another person eating meals?: None Help from another person taking  care of personal grooming?: A Little Help from another person toileting, which includes using toliet, bedpan, or urinal?: A Little Help from another person bathing (including washing, rinsing, drying)?: A Lot Help from another person to put on and taking off regular upper body clothing?: A Little Help from another person to put on and taking off regular lower body clothing?: A Lot 6 Click Score: 17    End of Session CPM Right Knee CPM Right Knee: Off  OT Visit Diagnosis: Pain Pain - Right/Left: Right Pain - part of body: Knee   Activity Tolerance Other (comment) (pt felt overheated)   Patient Left in chair;with call bell/phone within reach   Nurse Communication          Time: 4431-5400 OT Time Calculation (min): 15 min  Charges: OT General Charges $OT Visit: 1 Visit OT Treatments $Self Care/Home Management : 8-22 mins  Lesle Chris, OTR/L [phone] 01/21/2017   Alverna Fawley 01/21/2017, 9:12 AM

## 2017-01-21 NOTE — Progress Notes (Signed)
Physical Therapy Treatment Patient Details Name: Mallory Hines MRN: [id number] DOB: 08/26/1950 Today's Date: 01/21/2017    History of Present Illness right TKA    PT Comments    The  Patient is  Ready to DC.   Follow Up Recommendations  DC plan and follow up therapy as arranged by surgeon;Outpatient PT     Equipment Recommendations  None recommended by PT    Recommendations for Other Services       Precautions / Restrictions Precautions Precautions: Fall;Knee Required Braces or Orthoses: Knee Immobilizer - Right Knee Immobilizer - Right: Discontinue once straight leg raise with < 10 degree lag    Mobility  Bed Mobility               General bed mobility comments: oob with NT  Transfers   Equipment used: Rolling walker (2 wheeled) Transfers: Sit to/from Stand Sit to Stand: Supervision         General transfer comment: cues for UE/LE placement  Ambulation/Gait Ambulation/Gait assistance: Min guard Ambulation Distance (Feet): 50 Feet Assistive device: Rolling walker (2 wheeled) Gait Pattern/deviations: Step-to pattern;Decreased stance time - right;Antalgic     General Gait Details: cues for sequence and RW distance from self   Stairs Stairs: Yes   Stair Management: Forwards;With crutches;One rail Right Number of Stairs: 4 General stair comments: spouse present to asssist  Wheelchair Mobility    Modified Rankin (Stroke Patients Only)       Balance                                            Cognition Arousal/Alertness: Awake/alert                                            Exercises Total Joint Exercises Ankle Circles/Pumps: PROM;Both;10 reps Quad Sets: AROM;Both;10 reps Heel Slides: AAROM Hip ABduction/ADduction: AAROM Straight Leg Raises: AAROM Goniometric ROM: 10-60 right knee    General Comments        Pertinent Vitals/Pain Pain Score: 4  Pain Location: right knee Pain Descriptors /  Indicators: Aching Pain Intervention(s): Monitored during session;Premedicated before session;Repositioned    Home Living                      Prior Function            PT Goals (current goals can now be found in the care plan section) Progress towards PT goals: Progressing toward goals    Frequency    7X/week      PT Plan Current plan remains appropriate    Co-evaluation              AM-PAC PT "6 Clicks" Daily Activity  Outcome Measure  Difficulty turning over in bed (including adjusting bedclothes, sheets and blankets)?: A Little Difficulty moving from lying on back to sitting on the side of the bed? : A Little Difficulty sitting down on and standing up from a chair with arms (e.g., wheelchair, bedside commode, etc,.)?: A Little Help needed moving to and from a bed to chair (including a wheelchair)?: A Little Help needed walking in hospital room?: A Little Help needed climbing 3-5 steps with a railing? : A Lot 6 Click Score: 17    End  of Session Equipment Utilized During Treatment: Gait belt;Right knee immobilizer Activity Tolerance: Patient tolerated treatment well Patient left: in chair;with call bell/phone within reach Nurse Communication: Mobility status PT Visit Diagnosis: Difficulty in walking, not elsewhere classified (R26.2);Pain Pain - Right/Left: Right Pain - part of body: Knee     Time: 1110-1141 PT Time Calculation (min) (ACUTE ONLY): 31 min  Charges:  $Gait Training: 8-22 mins $Therapeutic Exercise: 8-22 mins                    G CodesTresa Endo PT [phone]    Claretha Cooper 01/21/2017, 1:28 PM

## 2017-01-23 ENCOUNTER — Emergency Department (HOSPITAL_COMMUNITY): Payer: 59 | Admitting: Anesthesiology

## 2017-01-23 ENCOUNTER — Ambulatory Visit: Payer: Self-pay | Admitting: Orthopedic Surgery

## 2017-01-23 ENCOUNTER — Inpatient Hospital Stay (HOSPITAL_COMMUNITY)
Admission: EM | Admit: 2017-01-23 | Discharge: 2017-01-26 | DRG: 482 | Disposition: A | Discharge status: Skilled Nursing Facility | Payer: 59 | Attending: Orthopedic Surgery | Admitting: Orthopedic Surgery

## 2017-01-23 ENCOUNTER — Encounter (HOSPITAL_COMMUNITY)
Admission: EM | Disposition: A | Discharge status: Skilled Nursing Facility | Payer: Self-pay | Source: Home / Self Care | Attending: Orthopedic Surgery

## 2017-01-23 ENCOUNTER — Emergency Department (HOSPITAL_COMMUNITY): Payer: 59

## 2017-01-23 ENCOUNTER — Encounter (HOSPITAL_COMMUNITY): Payer: Self-pay | Admitting: Nurse Practitioner

## 2017-01-23 ENCOUNTER — Inpatient Hospital Stay (HOSPITAL_COMMUNITY): Payer: 59

## 2017-01-23 DIAGNOSIS — Z7901 Long term (current) use of anticoagulants: Secondary | ICD-10-CM | POA: Diagnosis not present

## 2017-01-23 DIAGNOSIS — Z79899 Other long term (current) drug therapy: Secondary | ICD-10-CM | POA: Diagnosis not present

## 2017-01-23 DIAGNOSIS — Z886 Allergy status to analgesic agent status: Secondary | ICD-10-CM

## 2017-01-23 DIAGNOSIS — Y92009 Unspecified place in unspecified non-institutional (private) residence as the place of occurrence of the external cause: Secondary | ICD-10-CM | POA: Diagnosis not present

## 2017-01-23 DIAGNOSIS — Z419 Encounter for procedure for purposes other than remedying health state, unspecified: Secondary | ICD-10-CM

## 2017-01-23 DIAGNOSIS — W010XXA Fall on same level from slipping, tripping and stumbling without subsequent striking against object, initial encounter: Secondary | ICD-10-CM | POA: Diagnosis present

## 2017-01-23 DIAGNOSIS — R52 Pain, unspecified: Secondary | ICD-10-CM

## 2017-01-23 DIAGNOSIS — Z885 Allergy status to narcotic agent status: Secondary | ICD-10-CM

## 2017-01-23 DIAGNOSIS — Z96651 Presence of right artificial knee joint: Secondary | ICD-10-CM | POA: Diagnosis present

## 2017-01-23 DIAGNOSIS — E559 Vitamin D deficiency, unspecified: Secondary | ICD-10-CM | POA: Diagnosis present

## 2017-01-23 DIAGNOSIS — I8390 Asymptomatic varicose veins of unspecified lower extremity: Secondary | ICD-10-CM | POA: Diagnosis present

## 2017-01-23 DIAGNOSIS — S72331A Displaced oblique fracture of shaft of right femur, initial encounter for closed fracture: Secondary | ICD-10-CM | POA: Diagnosis present

## 2017-01-23 DIAGNOSIS — Z800 Family history of malignant neoplasm of digestive organs: Secondary | ICD-10-CM

## 2017-01-23 DIAGNOSIS — S72351A Displaced comminuted fracture of shaft of right femur, initial encounter for closed fracture: Secondary | ICD-10-CM

## 2017-01-23 DIAGNOSIS — Z91048 Other nonmedicinal substance allergy status: Secondary | ICD-10-CM

## 2017-01-23 DIAGNOSIS — Y9389 Activity, other specified: Secondary | ICD-10-CM

## 2017-01-23 DIAGNOSIS — K5730 Diverticulosis of large intestine without perforation or abscess without bleeding: Secondary | ICD-10-CM | POA: Diagnosis present

## 2017-01-23 DIAGNOSIS — Z8249 Family history of ischemic heart disease and other diseases of the circulatory system: Secondary | ICD-10-CM | POA: Diagnosis not present

## 2017-01-23 DIAGNOSIS — Z833 Family history of diabetes mellitus: Secondary | ICD-10-CM | POA: Diagnosis not present

## 2017-01-23 DIAGNOSIS — Z85828 Personal history of other malignant neoplasm of skin: Secondary | ICD-10-CM

## 2017-01-23 DIAGNOSIS — I10 Essential (primary) hypertension: Secondary | ICD-10-CM | POA: Diagnosis present

## 2017-01-23 DIAGNOSIS — S7291XA Unspecified fracture of right femur, initial encounter for closed fracture: Secondary | ICD-10-CM

## 2017-01-23 HISTORY — PX: FEMUR IM NAIL: SHX1597

## 2017-01-23 LAB — CBC WITH DIFFERENTIAL/PLATELET
Basophils Absolute: 0 10*3/uL (ref 0.0–0.1)
Basophils Relative: 0 %
Eosinophils Absolute: 0 10*3/uL (ref 0.0–0.7)
Eosinophils Relative: 0 %
HCT: 38 % (ref 36.0–46.0)
Hemoglobin: 12.6 g/dL (ref 12.0–15.0)
Lymphocytes Relative: 8 %
Lymphs Abs: 0.8 10*3/uL (ref 0.7–4.0)
MCH: 29.4 pg (ref 26.0–34.0)
MCHC: 33.2 g/dL (ref 30.0–36.0)
MCV: 88.6 fL (ref 78.0–100.0)
Monocytes Absolute: 0.8 10*3/uL (ref 0.1–1.0)
Monocytes Relative: 9 %
Neutro Abs: 7.6 10*3/uL (ref 1.7–7.7)
Neutrophils Relative %: 83 %
Platelets: 312 10*3/uL (ref 150–400)
RBC: 4.29 MIL/uL (ref 3.87–5.11)
RDW: 12.8 % (ref 11.5–15.5)
WBC: 9.2 10*3/uL (ref 4.0–10.5)

## 2017-01-23 LAB — TYPE AND SCREEN
ABO/RH(D): AB POS
ANTIBODY SCREEN: NEGATIVE

## 2017-01-23 LAB — BASIC METABOLIC PANEL
Anion gap: 7 (ref 5–15)
BUN: 17 mg/dL (ref 6–20)
CO2: 30 mmol/L (ref 22–32)
Calcium: 8.8 mg/dL — ABNORMAL LOW (ref 8.9–10.3)
Chloride: 99 mmol/L — ABNORMAL LOW (ref 101–111)
Creatinine, Ser: 0.63 mg/dL (ref 0.44–1.00)
GFR calc Af Amer: >60 mL/min (ref >60)
GFR calc non Af Amer: >60 mL/min (ref >60)
Glucose, Bld: 141 mg/dL — ABNORMAL HIGH (ref 65–99)
Potassium: 4 mmol/L (ref 3.5–5.1)
Sodium: 136 mmol/L (ref 135–145)

## 2017-01-23 SURGERY — INSERTION, INTRAMEDULLARY ROD, FEMUR
Anesthesia: General | Site: Leg Upper | Laterality: Right

## 2017-01-23 MED ORDER — BISACODYL 10 MG RE SUPP: 10.0000 mg | Freq: Every day | RECTAL | Status: DC | PRN

## 2017-01-23 MED ORDER — PHENOL 1.4 % MT LIQD
1.0000 | OROMUCOSAL | Status: DC | PRN
  Filled 2017-01-23: qty 177

## 2017-01-23 MED ORDER — ACETAMINOPHEN 10 MG/ML IV SOLN: 1000.0000 mg | Freq: Once | INTRAVENOUS | Status: DC

## 2017-01-23 MED ORDER — FENTANYL CITRATE (PF) 100 MCG/2ML IJ SOLN
INTRAMUSCULAR | Status: AC
  Filled 2017-01-23: qty 2

## 2017-01-23 MED ORDER — METHOCARBAMOL 1000 MG/10ML IJ SOLN
500.0000 mg | Freq: Four times a day (QID) | INTRAVENOUS | Status: DC | PRN
  Administered 2017-01-23: 500 mg via INTRAVENOUS
  Filled 2017-01-23: qty 550

## 2017-01-23 MED ORDER — DEXAMETHASONE SODIUM PHOSPHATE 10 MG/ML IJ SOLN
INTRAMUSCULAR | Status: AC
  Filled 2017-01-23: qty 1

## 2017-01-23 MED ORDER — MORPHINE SULFATE (PF) 4 MG/ML IV SOLN: 1.0000 mg | INTRAVENOUS | Status: DC | PRN

## 2017-01-23 MED ORDER — ONDANSETRON HCL 4 MG/2ML IJ SOLN: 4.0000 mg | Freq: Four times a day (QID) | INTRAMUSCULAR | Status: DC | PRN

## 2017-01-23 MED ORDER — ROCURONIUM BROMIDE 100 MG/10ML IV SOLN
INTRAVENOUS | Status: DC | PRN
  Administered 2017-01-23: 10 mg via INTRAVENOUS
  Administered 2017-01-23: 40 mg via INTRAVENOUS

## 2017-01-23 MED ORDER — DEXAMETHASONE SODIUM PHOSPHATE 10 MG/ML IJ SOLN: 10.0000 mg | Freq: Once | INTRAMUSCULAR | Status: DC

## 2017-01-23 MED ORDER — FLEET ENEMA 7-19 GM/118ML RE ENEM: 1.0000 | ENEMA | Freq: Once | RECTAL | Status: DC | PRN

## 2017-01-23 MED ORDER — PROPOFOL 10 MG/ML IV BOLUS
INTRAVENOUS | Status: DC | PRN
  Administered 2017-01-23: 170 mg via INTRAVENOUS

## 2017-01-23 MED ORDER — DIPHENHYDRAMINE HCL 25 MG PO CAPS: 25.0000 mg | ORAL_CAPSULE | Freq: Every day | ORAL | Status: DC | PRN

## 2017-01-23 MED ORDER — METHOCARBAMOL 500 MG PO TABS
500.0000 mg | ORAL_TABLET | Freq: Four times a day (QID) | ORAL | Status: DC | PRN
  Administered 2017-01-24 – 2017-01-26 (×7): 500 mg via ORAL
  Filled 2017-01-23 (×6): qty 1

## 2017-01-23 MED ORDER — ONDANSETRON HCL 4 MG PO TABS: 4.0000 mg | ORAL_TABLET | Freq: Four times a day (QID) | ORAL | Status: DC | PRN

## 2017-01-23 MED ORDER — ACETAMINOPHEN 10 MG/ML IV SOLN
INTRAVENOUS | Status: DC | PRN
  Administered 2017-01-23: 1000 mg via INTRAVENOUS

## 2017-01-23 MED ORDER — ROCURONIUM BROMIDE 50 MG/5ML IV SOSY
PREFILLED_SYRINGE | INTRAVENOUS | Status: AC
  Filled 2017-01-23: qty 5

## 2017-01-23 MED ORDER — TRAMADOL HCL 50 MG PO TABS
50.0000 mg | ORAL_TABLET | Freq: Four times a day (QID) | ORAL | Status: DC | PRN
  Administered 2017-01-24 (×2): 50 mg via ORAL
  Filled 2017-01-23 (×2): qty 1

## 2017-01-23 MED ORDER — DOCUSATE SODIUM 100 MG PO CAPS
100.0000 mg | ORAL_CAPSULE | Freq: Two times a day (BID) | ORAL | Status: DC
  Administered 2017-01-24 – 2017-01-26 (×4): 100 mg via ORAL
  Filled 2017-01-23 (×4): qty 1

## 2017-01-23 MED ORDER — LIDOCAINE 2% (20 MG/ML) 5 ML SYRINGE
INTRAMUSCULAR | Status: AC
  Filled 2017-01-23: qty 5

## 2017-01-23 MED ORDER — HYDROMORPHONE HCL 1 MG/ML IJ SOLN
0.7500 mg | Freq: Once | INTRAMUSCULAR | Status: AC
  Administered 2017-01-23: 0.75 mg via INTRAVENOUS
  Filled 2017-01-23: qty 1

## 2017-01-23 MED ORDER — RIVAROXABAN 10 MG PO TABS
10.0000 mg | ORAL_TABLET | Freq: Every day | ORAL | Status: DC
  Administered 2017-01-24 – 2017-01-26 (×3): 10 mg via ORAL
  Filled 2017-01-23 (×3): qty 1

## 2017-01-23 MED ORDER — ACETAMINOPHEN 650 MG RE SUPP: 650.0000 mg | Freq: Four times a day (QID) | RECTAL | Status: DC | PRN

## 2017-01-23 MED ORDER — HYDROMORPHONE HCL 2 MG/ML IJ SOLN: 0.5000 mg | INTRAMUSCULAR | Status: AC | PRN

## 2017-01-23 MED ORDER — HYDROMORPHONE HCL 1 MG/ML IJ SOLN
1.0000 mg | Freq: Once | INTRAMUSCULAR | Status: AC
  Administered 2017-01-23: 1 mg via INTRAVENOUS
  Filled 2017-01-23: qty 1

## 2017-01-23 MED ORDER — SUGAMMADEX SODIUM 200 MG/2ML IV SOLN
INTRAVENOUS | Status: AC
  Filled 2017-01-23: qty 2

## 2017-01-23 MED ORDER — HYDROMORPHONE HCL 2 MG PO TABS
2.0000 mg | ORAL_TABLET | ORAL | Status: DC | PRN
  Administered 2017-01-26 (×2): 2 mg via ORAL
  Filled 2017-01-23: qty 2
  Filled 2017-01-23: qty 1

## 2017-01-23 MED ORDER — BENAZEPRIL HCL 10 MG PO TABS
20.0000 mg | ORAL_TABLET | Freq: Every day | ORAL | Status: DC
  Administered 2017-01-25 – 2017-01-26 (×2): 20 mg via ORAL
  Filled 2017-01-23 (×2): qty 2

## 2017-01-23 MED ORDER — PHENYLEPHRINE 40 MCG/ML (10ML) SYRINGE FOR IV PUSH (FOR BLOOD PRESSURE SUPPORT)
PREFILLED_SYRINGE | INTRAVENOUS | Status: AC
  Filled 2017-01-23: qty 10

## 2017-01-23 MED ORDER — FENTANYL CITRATE (PF) 100 MCG/2ML IJ SOLN
25.0000 ug | INTRAMUSCULAR | Status: DC | PRN
  Administered 2017-01-23: 50 ug via INTRAVENOUS

## 2017-01-23 MED ORDER — POLYETHYLENE GLYCOL 3350 17 G PO PACK
17.0000 g | PACK | Freq: Every day | ORAL | Status: DC | PRN
  Filled 2017-01-23: qty 1

## 2017-01-23 MED ORDER — TRANEXAMIC ACID 1000 MG/10ML IV SOLN: 1000.0000 mg | INTRAVENOUS | Status: DC

## 2017-01-23 MED ORDER — LACTATED RINGERS IV SOLN
INTRAVENOUS | Status: DC | PRN
  Administered 2017-01-23 (×2): via INTRAVENOUS

## 2017-01-23 MED ORDER — METOCLOPRAMIDE HCL 5 MG/ML IJ SOLN: 5.0000 mg | Freq: Three times a day (TID) | INTRAMUSCULAR | Status: DC | PRN

## 2017-01-23 MED ORDER — ONDANSETRON HCL 4 MG/2ML IJ SOLN
INTRAMUSCULAR | Status: DC | PRN
  Administered 2017-01-23: 4 mg via INTRAVENOUS

## 2017-01-23 MED ORDER — SODIUM CHLORIDE 0.9 % IV SOLN
INTRAVENOUS | Status: DC
  Administered 2017-01-23: 100 mL/h via INTRAVENOUS

## 2017-01-23 MED ORDER — FENTANYL CITRATE (PF) 100 MCG/2ML IJ SOLN
INTRAMUSCULAR | Status: DC | PRN
Start: 2017-01-23 — End: 2017-01-23
  Administered 2017-01-23 (×6): 50 ug via INTRAVENOUS

## 2017-01-23 MED ORDER — ACETAMINOPHEN 10 MG/ML IV SOLN
INTRAVENOUS | Status: AC
  Filled 2017-01-23: qty 100

## 2017-01-23 MED ORDER — METOCLOPRAMIDE HCL 5 MG PO TABS: 5.0000 mg | ORAL_TABLET | Freq: Three times a day (TID) | ORAL | Status: DC | PRN

## 2017-01-23 MED ORDER — CEFAZOLIN SODIUM-DEXTROSE 2-4 GM/100ML-% IV SOLN
INTRAVENOUS | Status: AC
  Filled 2017-01-23: qty 100

## 2017-01-23 MED ORDER — MENTHOL 3 MG MT LOZG: 1.0000 | LOZENGE | OROMUCOSAL | Status: DC | PRN

## 2017-01-23 MED ORDER — ACETAMINOPHEN 325 MG PO TABS: 650.0000 mg | ORAL_TABLET | Freq: Four times a day (QID) | ORAL | Status: DC | PRN

## 2017-01-23 MED ORDER — LORATADINE 10 MG PO TABS: 10.0000 mg | ORAL_TABLET | Freq: Every day | ORAL | Status: DC | PRN

## 2017-01-23 MED ORDER — PHENYLEPHRINE HCL 10 MG/ML IJ SOLN
INTRAMUSCULAR | Status: DC | PRN
  Administered 2017-01-23 (×2): 80 ug via INTRAVENOUS

## 2017-01-23 MED ORDER — SUCCINYLCHOLINE CHLORIDE 200 MG/10ML IV SOSY
PREFILLED_SYRINGE | INTRAVENOUS | Status: AC
  Filled 2017-01-23: qty 10

## 2017-01-23 MED ORDER — LORAZEPAM 2 MG/ML IJ SOLN
1.0000 mg | Freq: Once | INTRAMUSCULAR | Status: AC
  Administered 2017-01-23: 1 mg via INTRAVENOUS
  Filled 2017-01-23: qty 1

## 2017-01-23 MED ORDER — MIDAZOLAM HCL 5 MG/5ML IJ SOLN
INTRAMUSCULAR | Status: DC | PRN
  Administered 2017-01-23: 1 mg via INTRAVENOUS

## 2017-01-23 MED ORDER — METHOCARBAMOL 500 MG PO TABS: 500.0000 mg | ORAL_TABLET | Freq: Four times a day (QID) | ORAL | Status: AC | PRN

## 2017-01-23 MED ORDER — LACTATED RINGERS IV SOLN: INTRAVENOUS | Status: DC

## 2017-01-23 MED ORDER — LIDOCAINE HCL (CARDIAC) 20 MG/ML IV SOLN
INTRAVENOUS | Status: DC | PRN
  Administered 2017-01-23: 80 mg via INTRAVENOUS

## 2017-01-23 MED ORDER — SUCCINYLCHOLINE CHLORIDE 20 MG/ML IJ SOLN
INTRAMUSCULAR | Status: DC | PRN
  Administered 2017-01-23: 100 mg via INTRAVENOUS

## 2017-01-23 MED ORDER — METHOCARBAMOL 1000 MG/10ML IJ SOLN: 500.0000 mg | Freq: Four times a day (QID) | INTRAVENOUS | Status: AC | PRN

## 2017-01-23 MED ORDER — CEFAZOLIN SODIUM-DEXTROSE 2-4 GM/100ML-% IV SOLN
2.0000 g | Freq: Four times a day (QID) | INTRAVENOUS | Status: AC
  Administered 2017-01-24 (×2): 2 g via INTRAVENOUS
  Filled 2017-01-23 (×2): qty 100

## 2017-01-23 MED ORDER — FENTANYL CITRATE (PF) 100 MCG/2ML IJ SOLN
INTRAMUSCULAR | Status: AC
  Administered 2017-01-23: 50 ug via INTRAVENOUS
  Filled 2017-01-23: qty 2

## 2017-01-23 MED ORDER — SUGAMMADEX SODIUM 200 MG/2ML IV SOLN
INTRAVENOUS | Status: DC | PRN
  Administered 2017-01-23: 170 mg via INTRAVENOUS

## 2017-01-23 MED ORDER — DEXAMETHASONE SODIUM PHOSPHATE 4 MG/ML IJ SOLN
INTRAMUSCULAR | Status: DC | PRN
  Administered 2017-01-23: 10 mg via INTRAVENOUS

## 2017-01-23 MED ORDER — CEFAZOLIN SODIUM-DEXTROSE 2-4 GM/100ML-% IV SOLN
2.0000 g | Freq: Once | INTRAVENOUS | Status: AC
  Administered 2017-01-23: 2 g via INTRAVENOUS

## 2017-01-23 MED ORDER — ONDANSETRON HCL 4 MG/2ML IJ SOLN
INTRAMUSCULAR | Status: AC
  Filled 2017-01-23: qty 2

## 2017-01-23 MED ORDER — MIDAZOLAM HCL 2 MG/2ML IJ SOLN
INTRAMUSCULAR | Status: AC
  Filled 2017-01-23: qty 2

## 2017-01-23 MED ORDER — PROPOFOL 10 MG/ML IV BOLUS
INTRAVENOUS | Status: AC
  Filled 2017-01-23: qty 20

## 2017-01-23 MED ORDER — METHOCARBAMOL 500 MG PO TABS
500.0000 mg | ORAL_TABLET | Freq: Four times a day (QID) | ORAL | Status: DC | PRN
  Filled 2017-01-23 (×2): qty 1

## 2017-01-23 MED ORDER — SODIUM CHLORIDE 0.9 % IV SOLN
INTRAVENOUS | Status: DC
  Administered 2017-01-24: 02:00:00 via INTRAVENOUS

## 2017-01-23 SURGICAL SUPPLY — 64 items
BAG DECANTER FOR FLEXI CONT (MISCELLANEOUS) ×1 IMPLANT
BAG SPEC THK2 15X12 ZIP CLS (MISCELLANEOUS) ×2
BAG ZIPLOCK 12X15 (MISCELLANEOUS) ×4 IMPLANT
BANDAGE ACE 6X5 VEL STRL LF (GAUZE/BANDAGES/DRESSINGS) ×1 IMPLANT
BANDAGE ELASTIC 6 VELCRO ST LF (GAUZE/BANDAGES/DRESSINGS) ×3 IMPLANT
BIT DRILL CALIBRATED 4.3MMX365 (DRILL) ×1 IMPLANT
BIT DRILL CROWE PNT TWST 4.5MM (DRILL) ×1 IMPLANT
BLADE SAG 18X100X1.27 (BLADE) ×1 IMPLANT
BLADE SAW SGTL 11.0X1.19X90.0M (BLADE) ×1 IMPLANT
BOWL SMART MIX CTS (DISPOSABLE) ×1 IMPLANT
CLOSURE WOUND 1/2 X4 (GAUZE/BANDAGES/DRESSINGS) ×1
COVER SURGICAL LIGHT HANDLE (MISCELLANEOUS) ×4 IMPLANT
CUFF TOURN SGL QUICK 34 (TOURNIQUET CUFF) ×4
CUFF TRNQT CYL 34X4X40X1 (TOURNIQUET CUFF) ×2 IMPLANT
DECANTER SPIKE VIAL GLASS SM (MISCELLANEOUS) ×1 IMPLANT
DRAPE LG THREE QUARTER DISP (DRAPES) ×3 IMPLANT
DRAPE U-SHAPE 47X51 STRL (DRAPES) ×7 IMPLANT
DRILL CALIBRATED 4.3MMX365 (DRILL) ×4
DRILL CROWE POINT TWIST 4.5MM (DRILL) ×4
DRSG ADAPTIC 3X8 NADH LF (GAUZE/BANDAGES/DRESSINGS) ×4 IMPLANT
DRSG MEPILEX BORDER 4X4 (GAUZE/BANDAGES/DRESSINGS) ×3 IMPLANT
DRSG PAD ABDOMINAL 8X10 ST (GAUZE/BANDAGES/DRESSINGS) ×4 IMPLANT
DURAPREP 26ML APPLICATOR (WOUND CARE) ×4 IMPLANT
ELECT REM PT RETURN 15FT ADLT (MISCELLANEOUS) ×4 IMPLANT
EVACUATOR 1/8 PVC DRAIN (DRAIN) ×4 IMPLANT
GAUZE SPONGE 4X4 12PLY STRL (GAUZE/BANDAGES/DRESSINGS) ×4 IMPLANT
GLOVE BIO SURGEON STRL SZ7.5 (GLOVE) ×3 IMPLANT
GLOVE BIO SURGEON STRL SZ8 (GLOVE) ×4 IMPLANT
GLOVE BIOGEL PI IND STRL 6.5 (GLOVE) IMPLANT
GLOVE BIOGEL PI IND STRL 8 (GLOVE) ×3 IMPLANT
GLOVE BIOGEL PI INDICATOR 6.5 (GLOVE)
GLOVE BIOGEL PI INDICATOR 8 (GLOVE) ×4
GLOVE SURG SS PI 6.5 STRL IVOR (GLOVE) IMPLANT
GOWN STRL REUS W/TWL LRG LVL3 (GOWN DISPOSABLE) ×4 IMPLANT
GOWN STRL REUS W/TWL XL LVL3 (GOWN DISPOSABLE) ×6 IMPLANT
GUIDEWIRE BEAD TIP (WIRE) ×3 IMPLANT
HANDPIECE INTERPULSE COAX TIP (DISPOSABLE)
IMMOBILIZER KNEE 20 (SOFTGOODS)
IMMOBILIZER KNEE 20 THIGH 36 (SOFTGOODS) ×1 IMPLANT
MANIFOLD NEPTUNE II (INSTRUMENTS) ×4 IMPLANT
NAIL FEM RETRO 10.5X340 (Nail) ×3 IMPLANT
NS IRRIG 1000ML POUR BTL (IV SOLUTION) ×4 IMPLANT
PACK TOTAL KNEE CUSTOM (KITS) ×4 IMPLANT
PADDING CAST COTTON 6X4 STRL (CAST SUPPLIES) ×6 IMPLANT
POSITIONER SURGICAL ARM (MISCELLANEOUS) ×4 IMPLANT
REAMER ONE STEP 12.2MM (TRAUMA) ×3 IMPLANT
SCREW CORT TI DBL LEAD 5X32 (Screw) ×3 IMPLANT
SCREW CORT TI DBL LEAD 5X50 (Screw) ×3 IMPLANT
SCREW CORT TI DBL LEAD 5X65 (Screw) ×3 IMPLANT
SET HNDPC FAN SPRY TIP SCT (DISPOSABLE) ×1 IMPLANT
STRIP CLOSURE SKIN 1/2X4 (GAUZE/BANDAGES/DRESSINGS) ×4 IMPLANT
SUT ETHILON 4 0 PS 2 18 (SUTURE) ×3 IMPLANT
SUT MNCRL AB 4-0 PS2 18 (SUTURE) ×4 IMPLANT
SUT STRATAFIX 0 PDS 27 VIOLET (SUTURE)
SUT VIC AB 2-0 CT1 27 (SUTURE) ×16
SUT VIC AB 2-0 CT1 TAPERPNT 27 (SUTURE) ×7 IMPLANT
SUT VLOC 180 0 24IN GS25 (SUTURE) ×3 IMPLANT
SUTURE STRATFX 0 PDS 27 VIOLET (SUTURE) ×1 IMPLANT
SYR 30ML LL (SYRINGE) ×5 IMPLANT
TRAY FOLEY W/METER SILVER 14FR (SET/KITS/TRAYS/PACK) ×3 IMPLANT
TRAY FOLEY W/METER SILVER 16FR (SET/KITS/TRAYS/PACK) ×1 IMPLANT
WATER STERILE IRR 1000ML POUR (IV SOLUTION) ×5 IMPLANT
WRAP KNEE MAXI GEL POST OP (GAUZE/BANDAGES/DRESSINGS) ×1 IMPLANT
YANKAUER SUCT BULB TIP 10FT TU (MISCELLANEOUS) ×1 IMPLANT

## 2017-01-23 NOTE — Anesthesia Preprocedure Evaluation (Signed)
Anesthesia Evaluation  Patient identified by MRN, date of birth, ID band Patient awake  General Assessment Comment:Fall history noted. CG  Reviewed: Allergy & Precautions, NPO status , Patient's Chart, lab work & pertinent test results  Airway Mallampati: II  TM Distance: >3 FB     Dental   Pulmonary neg pulmonary ROS,    breath sounds clear to auscultation       Cardiovascular hypertension, + Peripheral Vascular Disease   Rhythm:Regular Rate:Normal     Neuro/Psych    GI/Hepatic Neg liver ROS,   Endo/Other  negative endocrine ROS  Renal/GU      Musculoskeletal  (+) Arthritis ,   Abdominal   Peds  Hematology   Anesthesia Other Findings   Reproductive/Obstetrics                             Anesthesia Physical Anesthesia Plan  ASA: III and emergent  Anesthesia Plan: General   Post-op Pain Management:    Induction: Intravenous  PONV Risk Score and Plan: 3 and Ondansetron, Dexamethasone, Midazolam, Propofol infusion and Treatment may vary due to age or medical condition  Airway Management Planned: Oral ETT  Additional Equipment:   Intra-op Plan:   Post-operative Plan: Extubation in OR  Informed Consent: I have reviewed the patients History and Physical, chart, labs and discussed the procedure including the risks, benefits and alternatives for the proposed anesthesia with the patient or authorized representative who has indicated his/her understanding and acceptance.   Dental advisory given  Plan Discussed with: CRNA and Anesthesiologist  Anesthesia Plan Comments:         Anesthesia Quick Evaluation

## 2017-01-23 NOTE — ED Triage Notes (Signed)
Patient comes from home. She fell last night and now has pain in her right hip extending down her right thigh. Patient has knee surgery a week ago. She was released from the hospital with pain medications that made her unstable and she fell a day after she was discharged. Patient is A/Ox4. Patient had 200 mcg of fentanyl. Patient is on blood thinners post surgery.

## 2017-01-23 NOTE — H&P (Signed)
Mallory Hines is an 66 y.o. female.   Chief Complaint:  Right thigh pain. HPI: 66 year old female with right lower extremity pain. She had mechanical fall at home. She had her right knee replacement on Monday. She reports that she had been recovering well to the point. She is trying to get dressed when she lost her balance and fell. She said severe right hip, thigh and knee pain since then. She presenting today because continued severe pain. She cannot bear any weight on it. Denies any other acute injuries. She was just started on Xarelto prophylactically post-op. Seen by Dr. Wynelle Link and admitted for surgical fixation.   Past Medical History:  Diagnosis Date  . Diverticulosis of colon (without mention of hemorrhage)   . Hypertension   . Skin cancer [id number]   right calf, face  . 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 CESAREAN SECTION  1985  . COLONOSCOPY    . FLEXIBLE SIGMOIDOSCOPY    . FOOT SURGERY Right    3 rd toe  . TOTAL KNEE ARTHROPLASTY Right 01/19/2017   Procedure: RIGHT TOTAL KNEE ARTHROPLASTY;  Surgeon: Gaynelle Arabian, MD;  Location: [facility];  Service: Orthopedics;  Laterality: Right;  with block    Family History  Problem Relation Age of Onset  . Pancreatic cancer Mother   . Heart attack Father 5  . Diabetes Sister   . Colon cancer Neg Hx    Social History:  reports that she has never smoked. She has never used smokeless tobacco. She reports that she drinks about 0.6 oz of alcohol per week . She reports that she does not use drugs. Living situation  live with spouse Marital status  married Drug/Alcohol Rehab (Previously)  no Most recent primary occupation  Homemaker No history of drug/alcohol rehab  Not under pain contract  Number of flights of stairs before winded  greater than 5 Pain Contract   no Illicit drug use  no Tobacco use  Never smoker. 10/03/2013 never smoker Tobacco / smoke exposure  no Post-Surgical Plans  Home With Family. Advance Directives  Living Will, Healthcare Power of [place].  Allergies:  Allergies  Allergen Reactions  . Amlodipine Other (See Comments)    Leg cramps  . Celecoxib Other (See Comments)    REACTION: whelps  . Codeine Nausea And Vomiting    REACTION: does not like drug  . Meloxicam Other (See Comments)    REACTION: whelps  . Nsaids Other (See Comments)    REACTION: whelps  . Adhesive [Tape] Rash    Results for orders placed or performed during the hospital encounter of 01/23/17 (from the past 48 hour(s))  CBC with Differential     Status: None   Collection Time: 01/23/17  1:24 PM  Result Value Ref Range   WBC 9.2 4.0 - 10.5 K/uL   RBC 4.29 3.87 - 5.11 MIL/uL   Hemoglobin 12.6 12.0 - 15.0 g/dL   HCT 38.0 36.0 - 46.0 %   MCV 88.6 78.0 - 100.0 fL   MCH 29.4 26.0 - 34.0 pg   MCHC 33.2 30.0 - 36.0 g/dL   RDW 12.8 11.5 - 15.5 %   Platelets 312 150 - 400 K/uL   Neutrophils Relative % 83 %   Neutro Abs 7.6 1.7 - 7.7 K/uL   Lymphocytes Relative 8 %   Lymphs Abs 0.8 0.7 -  4.0 K/uL   Monocytes Relative 9 %   Monocytes Absolute 0.8 0.1 - 1.0 K/uL   Eosinophils Relative 0 %   Eosinophils Absolute 0.0 0.0 - 0.7 K/uL   Basophils Relative 0 %   Basophils Absolute 0.0 0.0 - 0.1 K/uL  Basic metabolic panel     Status: Abnormal   Collection Time: 01/23/17  1:24 PM  Result Value Ref Range   Sodium 136 135 - 145 mmol/L   Potassium 4.0 3.5 - 5.1 mmol/L   Chloride 99 (L) 101 - 111 mmol/L   CO2 30 22 - 32 mmol/L   Glucose, Bld 141 (H) 65 - 99 mg/dL   BUN 17 6 - 20 mg/dL   Creatinine, Ser 0.63 0.44 - 1.00 mg/dL   Calcium 8.8 (L) 8.9 - 10.3 mg/dL   GFR calc non Af Amer >60 >60 mL/min   GFR calc Af Amer >60 >60 mL/min    Comment: (NOTE) The eGFR has been calculated using the CKD EPI equation. This calculation has not been validated in  all clinical situations. eGFR's persistently <60 mL/min signify possible Chronic Kidney Disease.    Anion gap 7 5 - 15   Dg Knee 1-2 Views Right  Result Date: 01/23/2017 CLINICAL DATA:  Recent total knee replacement, now with right leg pain. EXAM: RIGHT KNEE - 1-2 VIEW COMPARISON:  None. FINDINGS: There is an a obliquely oriented midshaft femoral fracture with approximately 9.5 cm of foreshortening and posterior angulation. No definitive intra-articular extension. Post left total knee replacement. No definite evidence of hardware failure or loosening given obliquity. There is a minimal amount of residual intra-articular air, an expected finding given recent total knee replacement. No definite radiopaque foreign body. IMPRESSION: 1. Acute obliquely oriented midshaft femoral fracture with foreshortening. 2. Post total knee replacement without evidence of hardware failure or loosening. Electronically Signed   By: Sandi Mariscal M.D.   On: 01/23/2017 14:41   Dg Hip Unilat W Or Wo Pelvis 2-3 Views Right  Result Date: 01/23/2017 CLINICAL DATA:  Recent total knee replacement now with right leg pain. EXAM: DG HIP (WITH OR WITHOUT PELVIS) 2-3V RIGHT COMPARISON:  Right femur radiographs - earlier same day FINDINGS: No fracture or dislocation. Right hip joint spaces appear preserved. No evidence avascular necrosis. Limited visualization the pelvis and contralateral left hip is normal. Punctate phleboliths overlies the right hemipelvis. Regional soft tissues appear otherwise normal. IMPRESSION: 1. Known midshaft femoral fracture is not imaged on the present examination. Correlation with dedicated right femur radiographs is recommended. 2. No proximal femoral or right hip fracture. Electronically Signed   By: Sandi Mariscal M.D.   On: 01/23/2017 14:43    Review of Systems  Constitutional: Negative.   HENT: Negative.   Eyes: Negative.   Respiratory: Negative.   Cardiovascular: Negative.   Gastrointestinal:  Negative.   Genitourinary: Negative.   Musculoskeletal:       Pain in right thigh following fall   Living situation  live with spouse Marital status  married Drug/Alcohol Rehab (Previously)  no Most recent primary occupation  Homemaker No history of drug/alcohol rehab  Not under pain contract  Number of flights of stairs before winded  greater than 5 Pain Contract  no Illicit drug use  no Tobacco use  Never smoker. 10/03/2013 never smoker Tobacco / smoke exposure  no Post-Surgical Plans  Home With Family. Advance Directives  Living Will, Healthcare Power of [place].  Vitals O2 Sat - 99% Blood Pressue - 98/77 Pulse -  89 Resp 14   Physical Exam   General Mental Status -Alert, cooperative and good historian. General Appearance-pleasant,mild distress secondary to fracture Orientation-Oriented X3. Build & Nutrition-Well nourished and Well developed.  Head and Neck Head-normocephalic, atraumatic . Neck Global Assessment - supple, no bruit auscultated on the right, no bruit auscultated on the left.  Eye Pupil - Bilateral-Regular and Round. Motion - Bilateral-EOMI.  Chest and Lung Exam Auscultation Breath sounds - clear at anterior chest wall and clear at posterior chest wall. Adventitious sounds - No Adventitious sounds.  Cardiovascular Auscultation Rhythm - Regular rate and rhythm. Heart Sounds - S1 WNL and S2 WNL. Murmurs & Other Heart Sounds - Auscultation of the heart reveals - No Murmurs.  Abdomen Palpation/Percussion Tenderness - Abdomen is non-tender to palpation. Rigidity (guarding) - Abdomen is soft. Auscultation Auscultation of the abdomen reveals - Bowel sounds normal.  Female Genitourinary Note: Not done, not pertinent to present illness  Musculoskeletal Right lower extremity noted to be shortened and externally rotated on exam Motor intact - moving foot and toes slowly on exam. Distal pulses intact Sensation  intact.  IMPRESSION: 1. Known midshaft femoral fracture is not imaged on the present examination. Correlation with dedicated right femur radiographs is recommended. 2. No proximal femoral or right hip fracture.  Assessment/Plan Displaced right midshaft femur fracture  Patient seen in the ED at [facility] by Dr. Wynelle Link.  Events of fall reviewed and the findings were discussed with the patient.  She is just s/p right total knee and understands the change of maybe requiring transfusion with this procedure. Felt that she will require surgical intervention at this time. Surgery to be performed by Dr. Wynelle Link tonight.  ALEXZANDREW PERKINS, PA-C 01/23/2017, 4:19 PM

## 2017-01-23 NOTE — Anesthesia Procedure Notes (Signed)
Procedure Name: Intubation Date/Time: 01/23/2017 5:09 PM Performed by: Claudia Desanctis Pre-anesthesia Checklist: Patient identified, Emergency Drugs available, Suction available and Patient being monitored Patient Re-evaluated:Patient Re-evaluated prior to induction Oxygen Delivery Method: Circle system utilized Preoxygenation: Pre-oxygenation with 100% oxygen Induction Type: IV induction and Rapid sequence Ventilation: Mask ventilation without difficulty Laryngoscope Size: 2 and Miller Grade View: Grade I Tube type: Oral Tube size: 7.0 mm Number of attempts: 1 Airway Equipment and Method: Stylet Placement Confirmation: ETT inserted through vocal cords under direct vision,  positive ETCO2 and breath sounds checked- equal and bilateral Secured at: 22 cm Tube secured with: Tape Dental Injury: Teeth and Oropharynx as per pre-operative assessment

## 2017-01-23 NOTE — ED Notes (Signed)
Report given to OR RN. OR transport pick up patient

## 2017-01-23 NOTE — ED Provider Notes (Signed)
[facility] DEPT Provider Note   CSN: [id number] Arrival date & time: 01/23/17  1151     History   Chief Complaint No chief complaint on file.   HPI Mallory Hines is a 66 y.o. female.  HPI   66 year old female with right lower extremity pain. She had mechanical fall yesterday. She had her right knee replacement on Monday. She reports that she had been recovering well to the point. She is trying to get dressed when she lost her balance and fell. She said severe right hip, thigh and knee pain since then. She presenting today because continued severe pain. She cannot bear any weight on it. Denies any other acute injuries. She was just started on Xarelto prophylactically post-op.   Past Medical History:  Diagnosis Date  . Diverticulosis of colon (without mention of hemorrhage)   . Hypertension   . Skin cancer [id number]   right calf, face  . Varicose veins     Patient Active Problem List   Diagnosis Date Noted  . OA (osteoarthritis) of knee 01/19/2017  . Spider veins of both lower extremities 03/20/2015  . Varicose veins of leg with complications 24/40/1027  . Varicose veins of lower extremities with complications 25/36/6440  . Sinusitis, acute 07/10/2014  . LLQ abdominal pain 07/12/2013  . Diverticulitis of colon (without mention of hemorrhage)(562.11) 07/12/2013  . ABDOMINAL PAIN, LEFT LOWER QUADRANT 01/16/2010  . Cystitis 07/18/2009  . VITAMIN D DEFICIENCY 02/17/2008  . Essential hypertension 08/25/2007  . MELANOMA, LEG, HX OF 08/25/2007  . Unspecified personal history presenting hazards to health 08/25/2007    Past Surgical History:  Procedure Laterality Date  . CESAREAN SECTION  1985  . COLONOSCOPY    . FLEXIBLE SIGMOIDOSCOPY    . FOOT SURGERY Right    3 rd toe  . TOTAL KNEE ARTHROPLASTY Right 01/19/2017   Procedure: RIGHT TOTAL KNEE ARTHROPLASTY;  Surgeon: Gaynelle Arabian, MD;  Location: [facility];  Service: Orthopedics;  Laterality: Right;  with block    OB  History    No data available       Home Medications    Prior to Admission medications   Medication Sig Start Date End Date Taking? Authorizing Provider  amLODipine (NORVASC) 10 MG tablet TAKE 1 TABLET (10 MG TOTAL) BY MOUTH DAILY. Patient not taking: Reported on 01/08/2017 09/03/16   Golden Circle, FNP  benazepril (LOTENSIN) 20 MG tablet TAKE 1 TABLET (20 MG TOTAL) BY MOUTH DAILY. OVERDUE FOR FOLLOW-UP APPT MUST SEE PROVIDER FOR REFILLS 01/13/17   Golden Circle, FNP  diphenhydrAMINE (BENADRYL) 25 mg capsule Take 25 mg by mouth daily as needed for itching or allergies.    [provider]  HYDROmorphone (DILAUDID) 2 MG tablet Take 1-2 tablets (2-4 mg total) by mouth every 4 (four) hours as needed for severe pain. 01/20/17   Perkins, Alexzandrew L, PA-C  loratadine (CLARITIN) 10 MG tablet Take 10 mg by mouth daily as needed for allergies.    [provider]  methocarbamol (ROBAXIN) 500 MG tablet Take 1 tablet (500 mg total) by mouth every 6 (six) hours as needed for muscle spasms. 01/20/17   Perkins, Alexzandrew L, PA-C  mometasone (ELOCON) 0.1 % cream Apply 1 application topically at bedtime.    [provider]  mupirocin ointment (BACTROBAN) 2 % 1 application as directed. 01/13/17   [provider]  rivaroxaban (XARELTO) 10 MG TABS tablet Take 1 tablet (10 mg total) by mouth daily with breakfast. Take Xarelto for two  and a half more weeks following discharge from the hospital, then discontinue Xarelto. Once the patient has completed the blood thinner regimen, then take a Baby 81 mg Aspirin daily for three more weeks. 01/21/17   Perkins, Alexzandrew L, PA-C  traMADol (ULTRAM) 50 MG tablet Take 1-2 tablets (50-100 mg total) by mouth every 6 (six) hours as needed for moderate pain. 01/20/17   Perkins, Alexzandrew L, PA-C    Family History Family History  Problem Relation Age of Onset  . Pancreatic cancer Mother   . Heart attack Father 23  . Diabetes Sister     . Colon cancer Neg Hx     Social History Social History  Substance Use Topics  . Smoking status: Never Smoker  . Smokeless tobacco: Never Used  . Alcohol use 0.6 oz/week    1 Glasses of wine per week     Comment: rarely     Allergies   Amlodipine; Celecoxib; Codeine; Meloxicam; Nsaids; and Adhesive [tape]   Review of Systems Review of Systems  All systems reviewed and negative, other than as noted in HPI.  Physical Exam Updated Vital Signs BP (!) 134/101 (BP Location: Right Arm)   Pulse 70   Temp 97.8 F (36.6 C) (Oral)   Resp 18   Ht 5\' 2"  (1.575 m)   Wt 82.6 kg (182 lb)   SpO2 99%   BMI 33.29 kg/m   Physical Exam  Constitutional: She appears well-developed and well-nourished. No distress.  HENT:  Head: Normocephalic and atraumatic.  Eyes: Conjunctivae are normal. Right eye exhibits no discharge. Left eye exhibits no discharge.  Neck: Neck supple.  Cardiovascular: Normal rate, regular rhythm and normal heart sounds.  Exam reveals no gallop and no friction rub.   No murmur heard. Pulmonary/Chest: Effort normal and breath sounds normal. No respiratory distress.  Abdominal: Soft. She exhibits no distension. There is no tenderness.  Musculoskeletal: She exhibits no edema or tenderness.  Right lower extremity is shortened and externally rotated. Severe pain with attempted range of motion the right hip. Pelvis stable. Anterior right knee incision is intact. Mild swelling around the right knee which seems appropriate given the timing of her recent procedure. Palpable DP pulse. Can move her foot. Sensation intact to light touch.  Neurological: She is alert.  Skin: Skin is warm and dry.  Psychiatric: She has a normal mood and affect. Her behavior is normal. Thought content normal.  Nursing note and vitals reviewed.    ED Treatments / Results  Labs (all labs ordered are listed, but only abnormal results are displayed) Labs Reviewed  BASIC METABOLIC PANEL -  Abnormal; Notable for the following:       Result Value   Chloride 99 (*)    Glucose, Bld 141 (*)    Calcium 8.8 (*)    All other components within normal limits  CBC WITH DIFFERENTIAL/PLATELET    EKG  EKG Interpretation None       Radiology Dg Knee 1-2 Views Right  Result Date: 01/23/2017 CLINICAL DATA:  Recent total knee replacement, now with right leg pain. EXAM: RIGHT KNEE - 1-2 VIEW COMPARISON:  None. FINDINGS: There is an a obliquely oriented midshaft femoral fracture with approximately 9.5 cm of foreshortening and posterior angulation. No definitive intra-articular extension. Post left total knee replacement. No definite evidence of hardware failure or loosening given obliquity. There is a minimal amount of residual intra-articular air, an expected finding given recent total knee replacement. No definite radiopaque foreign body. IMPRESSION:  1. Acute obliquely oriented midshaft femoral fracture with foreshortening. 2. Post total knee replacement without evidence of hardware failure or loosening. Electronically Signed   By: Sandi Mariscal M.D.   On: 01/23/2017 14:41   Dg Hip Unilat W Or Wo Pelvis 2-3 Views Right  Result Date: 01/23/2017 CLINICAL DATA:  Recent total knee replacement now with right leg pain. EXAM: DG HIP (WITH OR WITHOUT PELVIS) 2-3V RIGHT COMPARISON:  Right femur radiographs - earlier same day FINDINGS: No fracture or dislocation. Right hip joint spaces appear preserved. No evidence avascular necrosis. Limited visualization the pelvis and contralateral left hip is normal. Punctate phleboliths overlies the right hemipelvis. Regional soft tissues appear otherwise normal. IMPRESSION: 1. Known midshaft femoral fracture is not imaged on the present examination. Correlation with dedicated right femur radiographs is recommended. 2. No proximal femoral or right hip fracture. Electronically Signed   By: Sandi Mariscal M.D.   On: 01/23/2017 14:43    Procedures Procedures (including  critical care time)  Medications Ordered in ED Medications  0.9 %  sodium chloride infusion (100 mL/hr Intravenous New Bag/Given 01/23/17 1322)  HYDROmorphone (DILAUDID) injection 1 mg (not administered)  LORazepam (ATIVAN) injection 1 mg (not administered)  HYDROmorphone (DILAUDID) injection 0.75 mg (0.75 mg Intravenous Given 01/23/17 1322)     Initial Impression / Assessment and Plan / ED Course  I have reviewed the triage vital signs and the nursing notes.  Pertinent labs & imaging results that were available during my care of the patient were reviewed by me and considered in my medical decision making (see chart for details).    66yF with RLE pain. Four days status post right knee replacement. Unfortunately, clinically I suspect she has a right hip fracture. NPO. Imaging. PRN pain meds. Will discuss with Dr Maureen Ralphs after imaging.   Actually has mid shaft femur fx. Dr Wynelle Link paged. Foley. Continued PRN meds. Will need admitted.    Final Clinical Impressions(s) / ED Diagnoses   Final diagnoses:  Closed displaced oblique fracture of shaft of right femur, initial encounter [facility]    New Prescriptions New Prescriptions   No medications on file     Virgel Manifold, MD 01/23/17 1511

## 2017-01-23 NOTE — Anesthesia Postprocedure Evaluation (Signed)
Anesthesia Post Note  Patient: Mallory Hines  Procedure(s) Performed: Procedure(s) (LRB): INTRAMEDULLARY (IM) NAIL FEMORAL (Right)     Patient location during evaluation: PACU Anesthesia Type: General Level of consciousness: awake Pain management: pain level controlled Vital Signs Assessment: post-procedure vital signs reviewed and stable Respiratory status: spontaneous breathing Cardiovascular status: stable Anesthetic complications: no    Last Vitals:  Vitals:   01/23/17 1923 01/23/17 1930  BP:  (!) 165/78  Pulse:  97  Resp:  19  Temp: 37.2 C   SpO2:  99%    Last Pain:  Vitals:   01/23/17 1923  TempSrc:   PainSc: Asleep                 Aleighya Mcanelly

## 2017-01-23 NOTE — Brief Op Note (Signed)
01/23/2017  6:57 PM  PATIENT:  Mertha Baars  66 y.o. female  PRE-OPERATIVE DIAGNOSIS:  fractured femur, right  POST-OPERATIVE DIAGNOSIS:  fractured femur, right  PROCEDURE:  Procedure(s): INTRAMEDULLARY (IM) NAIL FEMORAL (Right)  SURGEON:  Surgeon(s) and Role:    Gaynelle Arabian, MD - Primary  PHYSICIAN ASSISTANT:   ASSISTANTS: Arlee Muslim, PA-C   ANESTHESIA:   general  EBL:  Total I/O In: 1000 [I.V.:1000] Out: 100 [Blood:100]  BLOOD ADMINISTERED:none  DRAINS: (Medium) Hemovact drain(s) in the right knee with  Suction Open   LOCAL MEDICATIONS USED:  NONE  COUNTS:  YES  TOURNIQUET:   Total Tourniquet Time Documented: Thigh (Right) - 41 minutes Total: Thigh (Right) - 41 minutes   DICTATION: .Other Dictation: Dictation Number [id number]  PLAN OF CARE: Admit to inpatient   PATIENT DISPOSITION:  PACU - hemodynamically stable.

## 2017-01-23 NOTE — Transfer of Care (Signed)
Immediate Anesthesia Transfer of Care Note  Patient: Mallory Hines  Procedure(s) Performed: Procedure(s): INTRAMEDULLARY (IM) NAIL FEMORAL (Right)  Patient Location: PACU  Anesthesia Type:General  Level of Consciousness:  sedated, patient cooperative and responds to stimulation  Airway & Oxygen Therapy:Patient Spontanous Breathing and Patient connected to face mask oxgen  Post-op Assessment:  Report given to PACU RN and Post -op Vital signs reviewed and stable  Post vital signs:  Reviewed and stable  Last Vitals:  Vitals:   01/23/17 1630 01/23/17 1923  BP: (!) 106/48   Pulse: 89   Resp: 14   Temp:  (P) 37.2 C  SpO2: 10%     Complications: No apparent anesthesia complications

## 2017-01-23 NOTE — Op Note (Signed)
Mallory Hines, Mallory NO.:  [id number]  MEDICAL RECORD NO.:  [id number]  LOCATION:                                 FACILITY:  PHYSICIAN:  Gaynelle Arabian, M.D.         DATE OF BIRTH:  DATE OF PROCEDURE:  01/23/2017 DATE OF DISCHARGE:                              OPERATIVE REPORT   PREOPERATIVE DIAGNOSIS:  Right midshaft femur fracture.  POSTOPERATIVE DIAGNOSIS:  Right midshaft femur fracture.  PROCEDURE:  Retrograde intramedullary nailing, right femur.  SURGEON:  Gaynelle Arabian, M.D.  ASSISTANT:  Alexzandrew L. Perkins, PA-C.  ANESTHESIA:  General.  ESTIMATED BLOOD LOSS:  Minimal.  DRAINS:  Hemovac x1.  COMPLICATIONS:  None.  TOURNIQUET TIME:  41 minutes at 300 mmHg.  CONDITION:  Stable to recovery.  BRIEF CLINICAL NOTE:  Mallory Hines is a 66 year old female who underwent a right total knee arthroplasty 4 days ago.  She was at home doing well last night and went to put a shirt on and slipped, hitting the ground. She had immediate pain in her right thigh.  She presented to the emergency room today and was noted to have a midshaft femur fracture. This was not a periprosthetic fracture as it was in the midshaft of the femur.  She presents now for operative fixation.  PROCEDURE IN DETAIL:  After successful administration of general anesthetic, the right lower extremity and perineum were isolated from the trunk with plastic drapes, and prepped and draped in the usual sterile fashion.  I placed a sterile tourniquet, wrapped the leg in Esmarch and inflated the tourniquet to 300 mmHg.  I reutilized her previous knee incision and cut with a 10-blade through the subcutaneous tissue to the extensor mechanism.  A fresh blade was used to make a medial parapatellar arthrotomy.  The patella was subluxed laterally.  We thoroughly irrigated the joint and then I removed the plastic piece from the intercondylar aspect of the femoral component.  I then removed  the cement plug from that area.  I then placed the guidepin and then the starter reamer over the guidepin.  The ball-tipped guidewire was then passed through the femoral component up into the femoral canal across the fracture and into the proximal fragment.  We reamed over the guidewire starting at 9 mm up to 12 mm for placement of a 10.5-mm nail. We measured the length and this was 34 cm.  A 10.5 x 34 [place] retrograde intramedullary nail was attached to its external guide and was passed over the guidewire and impacted retrograde through the femur across the fracture site into the proximal femur.  This allowed anatomic reduction of this fracture in both AP and lateral planes.  Through the external guide, we then placed the two distal interlocks, which were distal in the femur, but proximal in the nail.  They were appropriate length and had excellent cortical purchase.  We then removed the external guide after locking them in place.  I then thoroughly irrigated the knee with saline and closed the arthrotomy over Hemovac drain with a running #1 V-Loc suture.  The tourniquet was released for total time of 41 minutes.  The proximal interlock, which was proximal in the femur, but distal in the nail was then identified and utilizing the freehand technique through a small incision anteriorly, I placed the 34-mm screw from anterior to posterior in the femur across the nail and locked it in place.  All the hardware was found to be in excellent position on AP and lateral fluoroscopic views.  The fracture was reduced anatomically.  The wound was copiously irrigated with saline solution and the interlocks were closed with interrupted 2-0 Vicryl deep and interrupted 4-0 nylon in the skin.  The rest of the knee incision was closed with interrupted 2-0 Vicryl subcutaneous and a 4.0 Monocryl subcuticular.  The drain was hooked to suction.  Incision was cleaned and dried, and Steri-Strips and a bulky  sterile dressing applied.  She was then placed into a knee immobilizer, awakened and transported to recovery in stable condition.  Note that a surgical assistant is a medical necessity for this procedure for safe and expeditious procedure.  The assistance was necessary for maintaining fracture reduction while the hardware was placed and then for assistance in placing the hardware as well as closure of the wounds.     Gaynelle Arabian, M.D.     FA/MEDQ  D:  01/23/2017  T:  01/23/2017  Job:  [id number]

## 2017-01-24 LAB — BASIC METABOLIC PANEL
ANION GAP: 8 (ref 5–15)
BUN: 16 mg/dL (ref 6–20)
CHLORIDE: 99 mmol/L — AB (ref 101–111)
CO2: 30 mmol/L (ref 22–32)
Calcium: 8.6 mg/dL — ABNORMAL LOW (ref 8.9–10.3)
Creatinine, Ser: 0.61 mg/dL (ref 0.44–1.00)
GFR calc Af Amer: >60 mL/min (ref >60)
GLUCOSE: 156 mg/dL — AB (ref 65–99)
POTASSIUM: 5.1 mmol/L (ref 3.5–5.1)
Sodium: 137 mmol/L (ref 135–145)

## 2017-01-24 LAB — CBC
HEMATOCRIT: 34.8 % — AB (ref 36.0–46.0)
Hemoglobin: 11.6 g/dL — ABNORMAL LOW (ref 12.0–15.0)
MCH: 29.8 pg (ref 26.0–34.0)
MCHC: 33.3 g/dL (ref 30.0–36.0)
MCV: 89.5 fL (ref 78.0–100.0)
PLATELETS: 354 10*3/uL (ref 150–400)
RBC: 3.89 MIL/uL (ref 3.87–5.11)
RDW: 12.9 % (ref 11.5–15.5)
WBC: 9.8 10*3/uL (ref 4.0–10.5)

## 2017-01-24 MED ORDER — OXYCODONE-ACETAMINOPHEN 5-325 MG PO TABS
1.0000 | ORAL_TABLET | ORAL | Status: DC | PRN
  Administered 2017-01-24 (×2): 2 via ORAL
  Administered 2017-01-24 – 2017-01-25 (×2): 1 via ORAL
  Administered 2017-01-25: 2 via ORAL
  Administered 2017-01-25 – 2017-01-26 (×4): 1 via ORAL
  Filled 2017-01-24 (×2): qty 1
  Filled 2017-01-24: qty 2
  Filled 2017-01-24 (×3): qty 1
  Filled 2017-01-24 (×2): qty 2
  Filled 2017-01-24 (×2): qty 1

## 2017-01-24 NOTE — Evaluation (Signed)
Physical Therapy Evaluation Patient Details Name: Mallory Hines MRN: [id number] DOB: 1950/08/29 Today's Date: 01/24/2017   History of Present Illness  s/p R TKA 01/19/17; discharged home and fell while getting dressed sustaining R mid shaft femur fx. S/p IM nail 01/23/17  Clinical Impression  Pt admitted with above diagnosis. Pt currently with functional limitations due to the deficits listed below (see PT Problem List). * Pt will benefit from skilled PT to increase their independence and safety with mobility to allow discharge to the venue listed below.    Pt will benefit from continued PT in  acute setting; will need post acute rehab secondary to recent TKA with subsequent fall with femur fx and IM nail; pt is motivated and cooperative but requires frequent redirection to task at home     Follow Up Recommendations CIR (vs SNF)    Equipment Recommendations  None recommended by PT    Recommendations for Other Services       Precautions / Restrictions Precautions Precautions: Fall;Knee Required Braces or Orthoses: Knee Immobilizer - Right Restrictions Weight Bearing Restrictions: Yes RLE Weight Bearing: Partial weight bearing RLE Partial Weight Bearing Percentage or Pounds: 25-50%      Mobility  Bed Mobility Overal bed mobility: Needs Assistance Bed Mobility: Supine to Sit     Supine to sit: Min assist;+2 for physical assistance;HOB elevated     General bed mobility comments: incr time, assist with trunk, RLE, incr time needed   Transfers Overall transfer level: Needs assistance Equipment used: Rolling walker (2 wheeled) Transfers: Sit to/from Stand Sit to Stand: Mod assist;+2 physical assistance;+2 safety/equipment;From elevated surface         General transfer comment: cues for UE/LE placement  Ambulation/Gait     Assistive device: Rolling walker (2 wheeled) Gait Pattern/deviations: Step-to pattern;Decreased stance time - right;Antalgic     General Gait  Details: multi-modal cues for sequence, use of UEs to maintian PWB and decr pain  Stairs            Wheelchair Mobility    Modified Rankin (Stroke Patients Only)       Balance Overall balance assessment: Needs assistance;History of Falls           Standing balance-Leahy Scale: Poor Standing balance comment: reliant on UEs                             Pertinent Vitals/Pain Pain Assessment: 0-10 Pain Score: 5  Pain Location: right knee Pain Descriptors / Indicators: Aching;Operative site guarding Pain Intervention(s): Limited activity within patient's tolerance;Premedicated before session;Monitored during session;Repositioned    Home Living Family/patient expects to be discharged to:: Inpatient rehab Living Arrangements: Spouse/significant other             Home Equipment: Shower seat - built in;Walker - 2 wheels;Cane - single point;Bedside commode      Prior Function Level of Independence: Needs assistance         Comments: has needed assistance since R TKR on 01/19/17     Hand Dominance        Extremity/Trunk Assessment   Upper Extremity Assessment Upper Extremity Assessment: Overall WFL for tasks assessed    Lower Extremity Assessment Lower Extremity Assessment: RLE deficits/detail RLE Deficits / Details: ankle WFL;  RLE: Unable to fully assess due to pain    Cervical / Trunk Assessment Cervical / Trunk Assessment: Normal  Communication   Communication: No difficulties  Cognition Arousal/Alertness: Awake/alert Behavior  During Therapy: WFL for tasks assessed/performed Overall Cognitive Status: Within Functional Limits for tasks assessed                                        General Comments      Exercises     Assessment/Plan    PT Assessment Patient needs continued PT services  PT Problem List Decreased strength;Decreased range of motion;Decreased activity tolerance;Decreased mobility;Decreased  knowledge of precautions;Decreased safety awareness;Decreased knowledge of use of DME;Pain       PT Treatment Interventions DME instruction;Gait training;Stair training;Functional mobility training;Patient/family education;Therapeutic activities    PT Goals (Current goals can be found in the Care Plan section)  Acute Rehab PT Goals Patient Stated Goal: to go to rehab  PT Goal Formulation: With patient/family Time For Goal Achievement: 01/26/2017 Potential to Achieve Goals: Good    Frequency 7X/week   Barriers to discharge        Co-evaluation PT/OT/SLP Co-Evaluation/Treatment: Yes Reason for Co-Treatment: For patient/therapist safety PT goals addressed during session: Mobility/safety with mobility OT goals addressed during session: ADL's and self-care;Proper use of Adaptive equipment and DME       AM-PAC PT "6 Clicks" Daily Activity  Outcome Measure Difficulty turning over in bed (including adjusting bedclothes, sheets and blankets)?: A Lot Difficulty moving from lying on back to sitting on the side of the bed? : A Lot Difficulty sitting down on and standing up from a chair with arms (e.g., wheelchair, bedside commode, etc,.)?: A Lot Help needed moving to and from a bed to chair (including a wheelchair)?: A Lot Help needed walking in hospital room?: A Lot Help needed climbing 3-5 steps with a railing? : Total 6 Click Score: 11    End of Session Equipment Utilized During Treatment: Gait belt;Right knee immobilizer Activity Tolerance: Patient tolerated treatment well Patient left: in chair;with call bell/phone within reach;with family/visitor present;with chair alarm set Nurse Communication: Mobility status PT Visit Diagnosis: Difficulty in walking, not elsewhere classified (R26.2);Pain Pain - Right/Left: Right Pain - part of body: Hip    Time: 4742-5956 PT Time Calculation (min) (ACUTE ONLY): 24 min   Charges:   PT Evaluation $PT Eval Moderate Complexity: 1 Mod      PT G Codes:          Lisbeth Puller 01/26/17, 5:12 PM

## 2017-01-24 NOTE — Progress Notes (Signed)
Mallory Hines  MRN: [id number] DOB/Age: 1950/08/10 66 y.o. Physician: Ander Slade, Mallory.D. 1 Day Post-Op Procedure(s) (LRB): INTRAMEDULLARY (IM) NAIL FEMORAL (Right)  Subjective: Reports moderate pain this am. meds only working for 2 hours. Vital Signs Temp:  [97.8 F (36.6 C)-99.5 F (37.5 C)] 99.5 F (37.5 C) (09/22 0537) Pulse Rate:  [65-97] 80 (09/22 0537) Resp:  [12-19] 16 (09/22 0537) BP: (98-181)/(41-101) 181/72 (09/22 0537) SpO2:  [90 %-100 %] 96 % (09/22 0537) Weight:  [82.6 kg (182 lb)] 82.6 kg (182 lb) (09/21 1205)  Lab Results  Recent Labs  01/23/17 1324 01/24/17 0538  WBC 9.2 9.8  HGB 12.6 11.6*  HCT 38.0 34.8*  PLT 312 354   BMET  Recent Labs  01/23/17 1324 01/24/17 0538  NA 136 137  K 4.0 5.1  CL 99* 99*  CO2 30 30  GLUCOSE 141* 156*  BUN 17 16  CREATININE 0.63 0.61  CALCIUM 8.8* 8.6*   INR  Date Value Ref Range Status  01/12/2017 1.05  Final     Exam  Good ankle motion, compartments soft, hemovac clotted, d/c'd. Grossly n/v intact distally  Plan Mobilize with partial WB RLE, trial oxycodone for improved pain control Mallory Hines Mallory Hines 01/24/2017, 9:35 AM    Contact # [phone]

## 2017-01-24 NOTE — Care Management Note (Addendum)
Case Management Note  Patient Details  Name: Mallory Hines MRN: [id number] Date of Birth: 08/16/1950  Subjective/Objective:  S/p R TKA on 9/17, pt was dc and fell at home. S/p IM nail on 9/21                  Action/Plan: Discharge Planning: NCM spoke to pt and husband at bedside. Husband states he prefers [place] for SNF rehab or IP rehab at [facility]. CSW referral for SNF placement. Will need IP rehab to come and evaluate appropriateness for rehab. Pt will need auth from insurance for either IP rehab or SNF. DC pending auth to SNF or IP rehab. CSW following for SNF-rehab. 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 Pt has RW and bedside commode at home.  PCP Velna Hatchet MD   Expected Discharge Date:                Expected Discharge Plan:  IP Rehab Facility  In-House Referral:  Clinical Social Work  Discharge planning Services  CM Consult  Post Acute Care Choice:  NA Choice offered to:  NA  DME Arranged:    DME Agency:  NA  HH Arranged:  NA HH Agency:  NA  Status of Service:  Completed, signed off  If discussed at [place] of Stay Meetings, dates discussed:    Additional Comments:  Erenest Rasher, RN 01/24/2017, 5:58 PM

## 2017-01-24 NOTE — Progress Notes (Signed)
Occupational Therapy Evaluation Patient Details Name: Mallory Hines MRN: [id number] DOB: 1950/08/14 Today's Date: 01/24/2017    History of Present Illness s/p R TKA 01/19/17; discharged home and fell while getting dressed sustaining R femur fx. S/p IM nail 01/23/17   Clinical Impression   Patient presents to OT with decreased ADL independence and safety due to the deficits listed below. She will benefit from skilled OT to maximize function and to facilitate a safe discharge. OT will follow.    Follow Up Recommendations  SNF    Equipment Recommendations  None recommended by OT    Recommendations for Other Services       Precautions / Restrictions Precautions Precautions: Fall;Knee Required Braces or Orthoses: Knee Immobilizer - Right Restrictions Weight Bearing Restrictions: Yes RLE Weight Bearing: Partial weight bearing RLE Partial Weight Bearing Percentage or Pounds: 25-50%      Mobility Bed Mobility Overal bed mobility: Needs Assistance Bed Mobility: Supine to Sit     Supine to sit: Min assist;+2 for physical assistance;HOB elevated        Transfers Overall transfer level: Needs assistance Equipment used: Rolling walker (2 wheeled) Transfers: Sit to/from Stand Sit to Stand: Mod assist;+2 physical assistance;+2 safety/equipment;From elevated surface              Balance                                           ADL either performed or assessed with clinical judgement   ADL Overall ADL's : Needs assistance/impaired Eating/Feeding: Set up;Sitting   Grooming: Set up;Sitting       Lower Body Bathing: Total assistance   Upper Body Dressing : Moderate assistance;Sitting Upper Body Dressing Details (indicate cue type and reason): don gown as robe Lower Body Dressing: Total assistance               Functional mobility during ADLs: Moderate assistance;+2 for physical assistance;+2 for safety/equipment;Rolling walker General ADL  Comments: Focus of treatment today was functional transfers/mobility with PWB status. Patient became hot after walking a few feet and also had arm fatigue due to PWB status. Up in recliner at end of session with visitors present     Vision         Perception     Praxis      Pertinent Vitals/Pain Pain Assessment: 0-10 Pain Score: 5  Pain Location: right knee Pain Descriptors / Indicators: Aching;Operative site guarding Pain Intervention(s): Limited activity within patient's tolerance;Monitored during session;Ice applied     Hand Dominance     Extremity/Trunk Assessment Upper Extremity Assessment Upper Extremity Assessment: Overall WFL for tasks assessed   Lower Extremity Assessment Lower Extremity Assessment: Defer to PT evaluation   Cervical / Trunk Assessment Cervical / Trunk Assessment: Normal   Communication Communication Communication: No difficulties   Cognition Arousal/Alertness: Awake/alert Behavior During Therapy: WFL for tasks assessed/performed Overall Cognitive Status: Within Functional Limits for tasks assessed                                     General Comments       Exercises     Shoulder Instructions      Home Living Family/patient expects to be discharged to:: Skilled nursing facility  Prior Functioning/Environment Level of Independence: Needs assistance        Comments: has needed assistance since R TKR on 01/19/17        OT Problem List: Decreased strength;Decreased activity tolerance;Impaired balance (sitting and/or standing);Decreased safety awareness;Decreased knowledge of use of DME or AE;Decreased knowledge of precautions;Pain      OT Treatment/Interventions: Self-care/ADL training;DME and/or AE instruction;Patient/family education    OT Goals(Current goals can be found in the care plan section) Acute Rehab OT Goals Patient Stated Goal: to go to rehab  OT  Goal Formulation: With patient Time For Goal Achievement: 01/31/17 Potential to Achieve Goals: Good  OT Frequency: Min 2X/week   Barriers to D/C:            Co-evaluation PT/OT/SLP Co-Evaluation/Treatment: Yes Reason for Co-Treatment: For patient/therapist safety PT goals addressed during session: Mobility/safety with mobility OT goals addressed during session: ADL's and self-care;Proper use of Adaptive equipment and DME      AM-PAC PT "6 Clicks" Daily Activity     Outcome Measure Help from another person eating meals?: None Help from another person taking care of personal grooming?: A Lot Help from another person toileting, which includes using toliet, bedpan, or urinal?: A Lot Help from another person bathing (including washing, rinsing, drying)?: A Lot Help from another person to put on and taking off regular upper body clothing?: A Lot Help from another person to put on and taking off regular lower body clothing?: A Lot 6 Click Score: 14   End of Session Equipment Utilized During Treatment: Rolling walker;Right knee immobilizer  Activity Tolerance: Patient tolerated treatment well Patient left: in chair;with call bell/phone within reach;with family/visitor present  OT Visit Diagnosis: Unsteadiness on feet (R26.81);Muscle weakness (generalized) (M62.81);Pain Pain - Right/Left: Right Pain - part of body: Hip;Knee                Time: 8546-2703 OT Time Calculation (min): 20 min Charges:  OT General Charges $OT Visit: 1 Visit OT Evaluation $OT Eval Moderate Complexity: 1 Mod G-Codes:       Rhaya Coale A Eoin Willden 01-25-2017, 3:13 PM

## 2017-01-25 LAB — BASIC METABOLIC PANEL
ANION GAP: 8 (ref 5–15)
BUN: 21 mg/dL — ABNORMAL HIGH (ref 6–20)
CHLORIDE: 102 mmol/L (ref 101–111)
CO2: 28 mmol/L (ref 22–32)
CREATININE: 0.55 mg/dL (ref 0.44–1.00)
Calcium: 8.4 mg/dL — ABNORMAL LOW (ref 8.9–10.3)
GFR calc non Af Amer: >60 mL/min (ref >60)
Glucose, Bld: 124 mg/dL — ABNORMAL HIGH (ref 65–99)
Potassium: 4.1 mmol/L (ref 3.5–5.1)
SODIUM: 138 mmol/L (ref 135–145)

## 2017-01-25 LAB — CBC
HCT: 31.6 % — ABNORMAL LOW (ref 36.0–46.0)
HEMOGLOBIN: 10.5 g/dL — AB (ref 12.0–15.0)
MCH: 29.7 pg (ref 26.0–34.0)
MCHC: 33.2 g/dL (ref 30.0–36.0)
MCV: 89.5 fL (ref 78.0–100.0)
Platelets: 298 10*3/uL (ref 150–400)
RBC: 3.53 MIL/uL — AB (ref 3.87–5.11)
RDW: 13 % (ref 11.5–15.5)
WBC: 7.2 10*3/uL (ref 4.0–10.5)

## 2017-01-25 NOTE — Progress Notes (Signed)
Physical Therapy Treatment Patient Details Name: Mallory Hines MRN: [id number] DOB: December 08, 1950 Today's Date: 01/25/2017    History of Present Illness s/p R TKA 01/19/17; discharged home and fell while getting dressed sustaining R mid shaft femur fx. S/p IM nail 01/23/17    PT Comments    Pt progressing nicely today; continues to c/o feeling "hot" when mobilizing (as pt did after TKA, likely medication related); will benefit from post acute rehab, likely would tolerate SNF setting well if able to get a bed at [facility]; will continue to follow   Follow Up Recommendations  SNF;CIR (depending on ins)     Equipment Recommendations  None recommended by PT    Recommendations for Other Services       Precautions / Restrictions Precautions Precautions: Fall;Knee Required Braces or Orthoses: Knee Immobilizer - Right Restrictions Weight Bearing Restrictions: Yes RLE Weight Bearing: Partial weight bearing RLE Partial Weight Bearing Percentage or Pounds: 25-50%    Mobility  Bed Mobility               General bed mobility comments:  (pt in chair on arrival)  Transfers Overall transfer level: Needs assistance Equipment used: Rolling walker (2 wheeled) Transfers: Sit to/from Stand Sit to Stand: Min guard;Min assist         General transfer comment: cues for UE/LE placement  Ambulation/Gait Ambulation/Gait assistance: Min assist Ambulation Distance (Feet): 20 Feet Assistive device: Rolling walker (2 wheeled) Gait Pattern/deviations: Step-to pattern;Decreased stance time - right;Antalgic     General Gait Details: multi-modal cues for sequence, use of UEs to maintian PWB and decr pain   Stairs            Wheelchair Mobility    Modified Rankin (Stroke Patients Only)       Balance                                            Cognition Arousal/Alertness: Awake/alert Behavior During Therapy: WFL for tasks assessed/performed Overall Cognitive  Status: Within Functional Limits for tasks assessed                                        Exercises Total Joint Exercises Ankle Circles/Pumps: Both;10 reps;AROM Quad Sets: AROM;Both;10 reps Heel Slides: AAROM;Right;10 reps Straight Leg Raises: AAROM;Right;10 reps    General Comments        Pertinent Vitals/Pain Pain Assessment: 0-10 Pain Score: 3  Pain Location: right knee Pain Descriptors / Indicators: Burning Pain Intervention(s): Limited activity within patient's tolerance;Monitored during session;Premedicated before session;Ice applied    Home Living                      Prior Function            PT Goals (current goals can now be found in the care plan section) Acute Rehab PT Goals Patient Stated Goal: to go to rehab  PT Goal Formulation: With patient/family Time For Goal Achievement: 01/31/17 Potential to Achieve Goals: Good Progress towards PT goals: Progressing toward goals    Frequency    7X/week      PT Plan Current plan remains appropriate    Co-evaluation              AM-PAC PT "6 Clicks" Daily Activity  Outcome Measure  Difficulty turning over in bed (including adjusting bedclothes, sheets and blankets)?: A Lot Difficulty moving from lying on back to sitting on the side of the bed? : A Lot Difficulty sitting down on and standing up from a chair with arms (e.g., wheelchair, bedside commode, etc,.)?: A Lot Help needed moving to and from a bed to chair (including a wheelchair)?: A Lot Help needed walking in hospital room?: A Lot Help needed climbing 3-5 steps with a railing? : Total 6 Click Score: 11    End of Session Equipment Utilized During Treatment: Gait belt;Right knee immobilizer Activity Tolerance: Patient tolerated treatment well Patient left: in chair;with call bell/phone within reach;with family/visitor present;with chair alarm set Nurse Communication: Mobility status PT Visit Diagnosis: Difficulty in  walking, not elsewhere classified (R26.2);Pain Pain - Right/Left: Right Pain - part of body: Knee;Hip     Time: 4818-5631 PT Time Calculation (min) (ACUTE ONLY): 33 min  Charges:  $Gait Training: 8-22 mins $Therapeutic Exercise: 8-22 mins                    G Codes:         Kit Brubacher 2017/01/29, 11:36 AM

## 2017-01-25 NOTE — Clinical Social Work Note (Signed)
Clinical Social Work Assessment  Patient Details  Name: Mallory Hines MRN: [id number] Date of Birth: 03-Mar-1951  Date of referral:  01/25/17               Reason for consult:  Facility Placement                Permission sought to share information with:  Chartered certified accountant granted to share information::  Yes, Verbal Permission Granted  Name::     Medical illustrator::  SNF  Relationship::  spouse  Contact Information:     Housing/Transportation Living arrangements for the past 2 months:  [place] of Information:  Patient, Spouse Patient Interpreter Needed:  None Criminal Activity/Legal Involvement Pertinent to Current Situation/Hospitalization:  No - Comment as needed Significant Relationships:  Spouse Lives with:  Spouse Do you feel safe going back to the place where you live?  No Need for family participation in patient care:  Yes (Comment)  Care giving concerns:  Pt resides at home with spouse. Pt had a left total knee on 9/21 then had a fall at home and sustained a femur fracture. Pt will need rehab at discharge. Pt wants to go to CIR and consult is pending. CSW will f/u for [facility] placement as a backup if denied from CIR.   Social Worker assessment / plan:  CSW explained her role and discussed the SNF options/placement. Pt has no experience with SNF. CSW discussed SNF process. Patient and spouse ad bedside indicated that they wants a "good" SNF for placement if they need to select. There first choice is [facility]. CSW obtained permission to send out to SNF's in the local area.  CSW will complete FL2, passr and send out offers. Employment status:  Retired Forensic scientist:  Other (Comment Required) Psychologist, counselling) PT Recommendations:  [place] / Referral to community resources:  [place]  Patient/Family's Response to care:  Patient/spouse appreciative of CSW assistance with SNF  placement.  Patient/Family's Understanding of and Emotional Response to Diagnosis, Current Treatment, and Prognosis:  Patient/family has good understanding of diagnosis, current treatment and prognosis. Pt hopeful that she will improve with rehab. No issues or concerns identified at this time.  Emotional Assessment Appearance:  Appears stated age Attitude/Demeanor/Rapport:    Affect (typically observed):  Accepting, Appropriate Orientation:  Oriented to Self, Oriented to Place, Oriented to  Time, Oriented to Situation Alcohol / Substance use:  Not Applicable Psych involvement (Current and /or in the community):  No (Comment)  Discharge Needs  Concerns to be addressed:  Care Coordination Readmission within the last 30 days:  Yes Current discharge risk:  Dependent with Mobility, Physical Impairment Barriers to Discharge:  No Barriers Identified   Normajean Baxter, LCSW 01/25/2017, 10:19 AM

## 2017-01-25 NOTE — Progress Notes (Signed)
Subjective: 2 Days Post-Op Procedure(s) (LRB): INTRAMEDULLARY (IM) NAIL FEMORAL (Right) Patient reports pain as moderate.    Objective: Vital signs in last 24 hours: Temp:  [98.4 F (36.9 C)-98.8 F (37.1 C)] 98.6 F (37 C) (09/23 0645) Pulse Rate:  [69-79] 79 (09/23 0645) Resp:  [16-18] 18 (09/23 0645) BP: (133-167)/(62-80) 146/80 (09/23 0645) SpO2:  [95 %-98 %] 97 % (09/23 0645)  Intake/Output from previous day: 09/22 0701 - 09/23 0700 In: 480 [P.O.:480] Out: 1500 [Urine:1500] Intake/Output this shift: Total I/O In: 0  Out: 650 [Urine:650]   Recent Labs  01/23/17 1324 01/24/17 0538 01/25/17 0525  HGB 12.6 11.6* 10.5*    Recent Labs  01/24/17 0538 01/25/17 0525  WBC 9.8 7.2  RBC 3.89 3.53*  HCT 34.8* 31.6*  PLT 354 298    Recent Labs  01/24/17 0538 01/25/17 0525  NA 137 138  K 5.1 4.1  CL 99* 102  CO2 30 28  BUN 16 21*  CREATININE 0.61 0.55  GLUCOSE 156* 124*  CALCIUM 8.6* 8.4*   No results for input(s): LABPT, INR in the last 72 hours.  Neurologically intact Neurovascular intact Incision: dressing C/D/I No cellulitis present Compartment soft  Assessment/Plan: 2 Days Post-Op Procedure(s) (LRB): INTRAMEDULLARY (IM) NAIL FEMORAL (Right) Advance diet Up with therapy D/C IV fluids Plan for discharge tomorrow to SNF Begin CPM for knee ROM  Nonnie Pickney V 01/25/2017, 9:39 AM

## 2017-01-25 NOTE — NC FL2 (Signed)
[place] LEVEL OF CARE SCREENING TOOL     IDENTIFICATION  Patient Name: Mallory Hines Birthdate: November 13, 1950 Sex: female Admission Date (Current Location): 01/23/2017  [facility] and [place] Number:  Herbalist and Address:  [facility],  [place] [address] [place]      Provider Number: [id number]  Attending Physician Name and Address:  Gaynelle Arabian, MD  Relative Name and Phone Number:  Rashaun, Curl [phone]    Current Level of Care: Hospital Recommended Level of Care: [place] Prior Approval Number:    Date Approved/Denied: 01/25/17 PASRR Number: [id number]  Discharge Plan: SNF    Current Diagnoses: Patient Active Problem List   Diagnosis Date Noted  . Displaced comminuted fracture of shaft of right femur, initial encounter for closed fracture ([place]) 01/23/2017  . Femur fracture, right ([place]) 01/23/2017  . OA (osteoarthritis) of knee 01/19/2017  . Spider veins of both lower extremities 03/20/2015  . Varicose veins of leg with complications 08/65/7846  . Varicose veins of lower extremities with complications 96/29/5284  . Sinusitis, acute 07/10/2014  . LLQ abdominal pain 07/12/2013  . Diverticulitis of colon (without mention of hemorrhage)(562.11) 07/12/2013  . ABDOMINAL PAIN, LEFT LOWER QUADRANT 01/16/2010  . Cystitis 07/18/2009  . VITAMIN D DEFICIENCY 02/17/2008  . Essential hypertension 08/25/2007  . MELANOMA, LEG, HX OF 08/25/2007  . Unspecified personal history presenting hazards to health 08/25/2007    Orientation RESPIRATION BLADDER Height & Weight     Self, Time, Situation, Place  Normal Indwelling catheter Weight: 182 lb (82.6 kg) Height:  5\' 2"  (157.5 cm)  BEHAVIORAL SYMPTOMS/MOOD NEUROLOGICAL BOWEL NUTRITION STATUS      Continent Diet (See DC Summary)  AMBULATORY STATUS COMMUNICATION OF NEEDS Skin   Limited Assist Verbally Surgical wounds (Right leg Incision with silicone dressing, Right  Knee)                       Personal Care Assistance Level of Assistance  Dressing, Bathing, Feeding Bathing Assistance: Maximum assistance Feeding assistance: Limited assistance Dressing Assistance: Maximum assistance     Functional Limitations Info             SPECIAL CARE FACTORS FREQUENCY  PT (By licensed PT)     PT Frequency: 7x week              Contractures      Additional Factors Info  Code Status, Allergies Code Status Info: Full Code Allergies Info: AMLODIPINE, CELECOXIB, CODEINE, MELOXICAM, NSAIDS, ADHESIVE TAPE            Current Medications (01/25/2017):  This is the current hospital active medication list Current Facility-Administered Medications  Medication Dose Route Frequency Provider Last Rate Last Dose  . 0.9 %  sodium chloride infusion   Intravenous Continuous Gaynelle Arabian, MD 75 mL/hr at 01/24/17 0200    . acetaminophen (TYLENOL) tablet 650 mg  650 mg Oral Q6H PRN Aluisio, Pilar Plate, MD       Or  . acetaminophen (TYLENOL) suppository 650 mg  650 mg Rectal Q6H PRN Aluisio, Pilar Plate, MD      . benazepril (LOTENSIN) tablet 20 mg  20 mg Oral Daily Gaynelle Arabian, MD   20 mg at 01/25/17 0855  . bisacodyl (DULCOLAX) suppository 10 mg  10 mg Rectal Daily PRN Aluisio, Pilar Plate, MD      . diphenhydrAMINE (BENADRYL) capsule 25 mg  25 mg Oral Daily PRN Gaynelle Arabian, MD      .  docusate sodium (COLACE) capsule 100 mg  100 mg Oral BID Gaynelle Arabian, MD   100 mg at 01/24/17 2138  . HYDROmorphone (DILAUDID) tablet 2-4 mg  2-4 mg Oral Q4H PRN Gaynelle Arabian, MD      . loratadine (CLARITIN) tablet 10 mg  10 mg Oral Daily PRN Aluisio, Pilar Plate, MD      . menthol-cetylpyridinium (CEPACOL) lozenge 3 mg  1 lozenge Oral PRN Gaynelle Arabian, MD       Or  . phenol (CHLORASEPTIC) mouth spray 1 spray  1 spray Mouth/Throat PRN Aluisio, Pilar Plate, MD      . methocarbamol (ROBAXIN) tablet 500 mg  500 mg Oral Q6H PRN Gaynelle Arabian, MD   500 mg at 01/25/17 0855   Or  .  methocarbamol (ROBAXIN) 500 mg in dextrose 5 % 50 mL IVPB  500 mg Intravenous Q6H PRN Gaynelle Arabian, MD   Stopped at 01/23/17 2019  . methocarbamol (ROBAXIN) tablet 500 mg  500 mg Oral Q6H PRN Aluisio, Pilar Plate, MD      . metoCLOPramide (REGLAN) tablet 5-10 mg  5-10 mg Oral Q8H PRN Gaynelle Arabian, MD       Or  . metoCLOPramide (REGLAN) injection 5-10 mg  5-10 mg Intravenous Q8H PRN Aluisio, Pilar Plate, MD      . morphine 4 MG/ML injection 1 mg  1 mg Intravenous Q2H PRN Aluisio, Pilar Plate, MD      . ondansetron (ZOFRAN) tablet 4 mg  4 mg Oral Q6H PRN Gaynelle Arabian, MD       Or  . ondansetron (ZOFRAN) injection 4 mg  4 mg Intravenous Q6H PRN Aluisio, Pilar Plate, MD      . oxyCODONE-acetaminophen (PERCOCET/ROXICET) 5-325 MG per tablet 1-2 tablet  1-2 tablet Oral Q4H PRN Justice Britain, MD   1 tablet at 01/25/17 1003  . polyethylene glycol (MIRALAX / GLYCOLAX) packet 17 g  17 g Oral Daily PRN Aluisio, Pilar Plate, MD      . rivaroxaban Alveda Reasons) tablet 10 mg  10 mg Oral Daily Gaynelle Arabian, MD   10 mg at 01/25/17 0855  . sodium phosphate (FLEET) 7-19 GM/118ML enema 1 enema  1 enema Rectal Once PRN Aluisio, Pilar Plate, MD      . traMADol Veatrice Bourbon) tablet 50-100 mg  50-100 mg Oral Q6H PRN Gaynelle Arabian, MD   50 mg at 01/24/17 0545   Facility-Administered Medications Ordered in Other Encounters  Medication Dose Route Frequency Provider Last Rate Last Dose  . HYDROmorphone (DILAUDID) injection 0.5-1 mg  0.5-1 mg Intravenous Q2H PRN Perkins, Alexzandrew L, PA-C      . methocarbamol (ROBAXIN) tablet 500 mg  500 mg Oral Q6H PRN Perkins, Alexzandrew L, PA-C       Or  . methocarbamol (ROBAXIN) 500 mg in dextrose 5 % 50 mL IVPB  500 mg Intravenous Q6H PRN Perkins, Alexzandrew L, PA-C         Discharge Medications: Please see discharge summary for a list of discharge medications.  Relevant Imaging Results:  Relevant Lab Results:   Additional Information SS#: [place], LCSW

## 2017-01-26 ENCOUNTER — Encounter (HOSPITAL_COMMUNITY): Payer: Self-pay | Admitting: Orthopedic Surgery

## 2017-01-26 LAB — CBC
HEMATOCRIT: 36.5 % (ref 36.0–46.0)
HEMOGLOBIN: 12 g/dL (ref 12.0–15.0)
MCH: 29.4 pg (ref 26.0–34.0)
MCHC: 32.9 g/dL (ref 30.0–36.0)
MCV: 89.5 fL (ref 78.0–100.0)
Platelets: 339 10*3/uL (ref 150–400)
RBC: 4.08 MIL/uL (ref 3.87–5.11)
RDW: 12.9 % (ref 11.5–15.5)
WBC: 7.2 10*3/uL (ref 4.0–10.5)

## 2017-01-26 MED ORDER — TRAMADOL HCL 50 MG PO TABS: 50.0000 mg | ORAL_TABLET | Freq: Four times a day (QID) | ORAL | 0 refills | Status: DC | PRN

## 2017-01-26 MED ORDER — BISACODYL 10 MG RE SUPP: 10.0000 mg | Freq: Every day | RECTAL | 0 refills | Status: DC | PRN

## 2017-01-26 MED ORDER — HYDROMORPHONE HCL 2 MG PO TABS: 2.0000 mg | ORAL_TABLET | ORAL | 0 refills | Status: DC | PRN

## 2017-01-26 MED ORDER — OXYCODONE-ACETAMINOPHEN 5-325 MG PO TABS: 1.0000 | ORAL_TABLET | ORAL | 0 refills | Status: DC | PRN

## 2017-01-26 MED ORDER — FLEET ENEMA 7-19 GM/118ML RE ENEM: 1.0000 | ENEMA | Freq: Once | RECTAL | 0 refills | Status: DC | PRN

## 2017-01-26 MED ORDER — DOCUSATE SODIUM 100 MG PO CAPS: 100.0000 mg | ORAL_CAPSULE | Freq: Two times a day (BID) | ORAL | 0 refills | Status: DC

## 2017-01-26 MED ORDER — POLYETHYLENE GLYCOL 3350 17 G PO PACK: 17.0000 g | PACK | Freq: Every day | ORAL | 0 refills | Status: DC | PRN

## 2017-01-26 MED ORDER — METHOCARBAMOL 500 MG PO TABS: 500.0000 mg | ORAL_TABLET | Freq: Four times a day (QID) | ORAL | 0 refills | Status: DC | PRN

## 2017-01-26 MED ORDER — RIVAROXABAN 10 MG PO TABS: 10.0000 mg | ORAL_TABLET | Freq: Every day | ORAL | 0 refills | Status: DC

## 2017-01-26 MED ORDER — ACETAMINOPHEN 325 MG PO TABS: 650.0000 mg | ORAL_TABLET | Freq: Four times a day (QID) | ORAL | 0 refills | Status: DC | PRN

## 2017-01-26 NOTE — Progress Notes (Signed)
   Subjective: 3 Days Post-Op Procedure(s) (LRB): INTRAMEDULLARY (IM) NAIL FEMORAL (Right) Patient reports pain as mild.   Patient seen in rounds for Dr. Wynelle Link. Patient is well, but has had some minor complaints of pain in the thigh and knee, requiring pain medications Patient is ready to go to the SNF - [facility]  Objective: Vital signs in last 24 hours: Temp:  [98.1 F (36.7 C)-98.3 F (36.8 C)] 98.2 F (36.8 C) (09/24 0607) Pulse Rate:  [69-84] 81 (09/24 0607) Resp:  [15-18] 15 (09/24 0607) BP: (127-165)/(67-76) 161/67 (09/24 0607) SpO2:  [97 %-100 %] 100 % (09/24 0607)  Intake/Output from previous day:  Intake/Output Summary (Last 24 hours) at 01/26/17 0758 Last data filed at 01/26/17 0607  Gross per 24 hour  Intake              840 ml  Output              400 ml  Net              440 ml    Intake/Output this shift: No intake/output data recorded.  Labs:  Recent Labs  01/23/17 1324 01/24/17 0538 01/25/17 0525 01/26/17 0514  HGB 12.6 11.6* 10.5* 12.0    Recent Labs  01/25/17 0525 01/26/17 0514  WBC 7.2 7.2  RBC 3.53* 4.08  HCT 31.6* 36.5  PLT 298 339    Recent Labs  01/24/17 0538 01/25/17 0525  NA 137 138  K 5.1 4.1  CL 99* 102  CO2 30 28  BUN 16 21*  CREATININE 0.61 0.55  GLUCOSE 156* 124*  CALCIUM 8.6* 8.4*   No results for input(s): LABPT, INR in the last 72 hours.  EXAM: General - Patient is Alert, Appropriate and Oriented Extremity - Neurovascular intact Sensation intact distally Intact pulses distally Dorsiflexion/Plantar flexion intact Incision - clean, dry, no drainage Motor Function - intact, moving foot and toes well on exam.   Assessment/Plan: 3 Days Post-Op Procedure(s) (LRB): INTRAMEDULLARY (IM) NAIL FEMORAL (Right) Procedure(s) (LRB): INTRAMEDULLARY (IM) NAIL FEMORAL (Right) Past Medical History:  Diagnosis Date  . Diverticulosis of colon (without mention of hemorrhage)   . Hypertension   . Skin cancer 2248,2500     right calf, face  . Varicose veins    Principal Problem:   Femur fracture, right (HCC)  Estimated body mass index is 33.29 kg/m as calculated from the following:   Height as of this encounter: 5\' 2"  (1.575 m).   Weight as of this encounter: 82.6 kg (182 lb). Up with therapy Discharge to SNF Diet - Cardiac diet Follow up - in 2 weeks Activity - PWB 25-50% Disposition - Skilled nursing facility Condition Upon Discharge - Stable D/C Meds - See DC Summary DVT Prophylaxis - [place], PA-C Orthopaedic Surgery 01/26/2017, 7:58 AM

## 2017-01-26 NOTE — Progress Notes (Signed)
Physical Therapy Treatment Patient Details Name: Mallory Hines MRN: [id number] DOB: 1950/10/06 Today's Date: 01/26/2017    History of Present Illness s/p R TKA 01/19/17; discharged home and fell while getting dressed sustaining R mid shaft femur fx. S/p IM nail 01/23/17    PT Comments    Progressing well, exercise focused session at this time,pt wanted to defer amb and save strength for D/C--car transfers, etc  Follow Up Recommendations  SNF     Equipment Recommendations  None recommended by PT    Recommendations for Other Services       Precautions / Restrictions Precautions Precautions: Fall;Knee Restrictions Weight Bearing Restrictions: Yes RLE Weight Bearing: Partial weight bearing RLE Partial Weight Bearing Percentage or Pounds: 25-50%    Mobility  Bed Mobility               General bed mobility comments: inc recliner  Transfers                    Ambulation/Gait                 Stairs            Wheelchair Mobility    Modified Rankin (Stroke Patients Only)       Balance                                            Cognition Arousal/Alertness: Awake/alert Behavior During Therapy: WFL for tasks assessed/performed Overall Cognitive Status: Within Functional Limits for tasks assessed                                        Exercises Total Joint Exercises Ankle Circles/Pumps: 20 reps;AROM;Both Quad Sets: AROM;Strengthening;Both;20 reps Heel Slides: AAROM;Right;10 reps Hip ABduction/ADduction: AAROM;Right;10 reps;Limitations Hip Abduction/Adduction Limitations: rest breaks needed d/t pain with movement Goniometric ROM: grossly 5 to 40* AAROM righ tknee flexion    General Comments        Pertinent Vitals/Pain Pain Assessment: 0-10 Pain Score: 4  Pain Location: right knee Pain Descriptors / Indicators: Sore Pain Intervention(s): Limited activity within patient's tolerance;Monitored  during session;RN gave pain meds during session;Ice applied    Home Living                      Prior Function            PT Goals (current goals can now be found in the care plan section) Acute Rehab PT Goals Patient Stated Goal: to go to rehab  PT Goal Formulation: With patient/family Time For Goal Achievement: 01/31/17 Potential to Achieve Goals: Good Progress towards PT goals: Progressing toward goals    Frequency    7X/week      PT Plan Current plan remains appropriate    Co-evaluation              AM-PAC PT "6 Clicks" Daily Activity  Outcome Measure  Difficulty turning over in bed (including adjusting bedclothes, sheets and blankets)?: A Lot Difficulty moving from lying on back to sitting on the side of the bed? : A Lot Difficulty sitting down on and standing up from a chair with arms (e.g., wheelchair, bedside commode, etc,.)?: A Lot Help needed moving to and from a bed to chair (including a  wheelchair)?: A Lot Help needed walking in hospital room?: A Lot Help needed climbing 3-5 steps with a railing? : A Lot 6 Click Score: 12    End of Session   Activity Tolerance: Patient tolerated treatment well Patient left: in chair;with call bell/phone within reach;with family/visitor present   PT Visit Diagnosis: Difficulty in walking, not elsewhere classified (R26.2);Pain Pain - Right/Left: Right Pain - part of body: Knee;Hip     Time: 0926-1000 PT Time Calculation (min) (ACUTE ONLY): 34 min  Charges:  $Therapeutic Exercise: 23-37 mins                    G Codes:          Mallory Hines 2017-02-24, 10:10 AM

## 2017-01-26 NOTE — Progress Notes (Signed)
Called report to [facility] at [facility].

## 2017-01-26 NOTE — Discharge Instructions (Addendum)
Femoral Shaft Fracture A femoral shaft fracture is a break (fracture) in the shaft of the thigh bone (femur). The femur is the long bone that connects the hip joint to the knee joint. Most femoral shaft fractures are closed fractures. A closed fracture is a break in a bone that happens without any cuts (lacerations) through the skin that is near the fracture site. Some femoral shaft fractures are open fractures. An open fracture is a break in a bone that happens along with lacerations through the skin that is near the fracture site. What are the causes? A healthy femur may break from a forceful impact, such as from:  A fall, especially from a great height.  A high-impact sports injury.  A car or motorcycle accident.  A weakened femur may break from minimal impact or force due to:  Certain medical conditions.  Age.  What increases the risk? This condition is more likely to develop in:  Older people.  People who have certain medical conditions that cause bones to become weak or thin, such as: ? Osteoporosis. ? Cancer. ? Osteogenesis imperfecta. This is a condition that involves bone weakness that is due to abnormal bone development.  People who take certain medicines (bisphosphonates) that are used to treat osteoporosis.  People who participate in high-risk sports or impact sports.  What are the signs or symptoms? Symptoms of this condition include:  Severe pain.  Inability to walk.  Bruising.  Swelling or visible deformity of the leg.  Substantial bleeding, if it is an open fracture.  How is this diagnosed? This condition is diagnosed based on your symptoms and a physical exam. You may also have other tests, including:  X-rays of the femur. Since the force required to break a healthy femur can break other bones, X-rays are often taken of the hip, knee, and pelvis as well.  Evaluation of the blood vessels with specialized X-rays (arteriogram).  Evaluation of the  nerves in the area of the break.  CT scan or MRI.  How is this treated? A femoral shaft fracture usually requires surgery. You may have one or more of the following surgical treatments:  External fixation. This involves using pins and screws to hold the bones in place if there is extensive soft tissue injury. After some time, you may need an additional surgical treatment, such as intramedullary nailing.  Intramedullary nailing. This involves inserting a rod (intramedullary nail) through an incision. The intramedullary nail goes down the center of the shaft of the femur. It may be inserted in the knee joint or from the top of the femur near the hip. Generally, screws are placed through the rod at both ends to prevent shortening or rotation of the femur as it heals.  Plates to stabilize the fracture. These may be used, especially when the fracture is at either end of the bone, near the hip or the knee.  In rare cases for which surgery is not an option, a cast or a splint may be used to hold (immobilize) the bone while it heals. How is this prevented? If factors such as certain medical conditions or age increase your risk for another femoral shaft fracture, you may be able to prevent one if you follow these instructions:  Use aids for walking, such as a walker or cane, as directed by your health care provider.  Follow instructions from your health care provider about how to strengthen your bones if you have osteoporosis.  This information is not intended to  replace advice given to you by your health care provider. Make sure you discuss any questions you have with your health care provider. Document Released: 01/29/2005 Document Revised: 09/27/2015 Document Reviewed: 12/05/2013 Elsevier Interactive Patient Education  2018 [place] for two and a half more weeks following discharge from the hospital, then discontinue Xarelto. Once the patient has completed the blood thinner  regimen, then take a Baby 81 mg Aspirin daily for three more weeks.  Partial Weight Bearing 25-50% to the Right Leg  Knee Immobilizer to the right leg when up OOB and weight bearing.  May have off in bed.  Continue the CPM at the SNF. CPM - Zero to 40 degrees, two session per day for two hours each.  Increase 10 degrees daily up to 90 degrees.

## 2017-01-26 NOTE — Progress Notes (Addendum)
CSW met with patient and Spouse at bedside, pt. And Spouse prefer [facility] and thought it was prearranged. Patient spouse is requesting to speak with PA. CSW informed Camera operator. CSW provided list bed offers and provided contact information for pt. And spouse.   Patient and spouse agreeable to [place]. Ship broker started.   Authorization received. Patient can discharge to SNF. Patient spouse to transport. Nurse please call report [phone]. Patient going to room. [place], Latanya Presser, MSW Clinical Social Worker 5E and [place] [phone] 01/26/2017  10:30 AM

## 2017-01-26 NOTE — Discharge Summary (Signed)
Physician Discharge Summary   Patient ID: Mallory Hines MRN: [id number] DOB/AGE: [birth year] 65 y.o.  Admit date: 01/23/2017 Discharge date: 01/26/2017  Primary Diagnosis:  Right midshaft femur fracture.  Admission Diagnoses:  Past Medical History:  Diagnosis Date  . Diverticulosis of colon (without mention of hemorrhage)   . Hypertension   . Skin cancer [id number]   right calf, face  . Varicose veins    Discharge Diagnoses:   Principal Problem:   Femur fracture, right ([place])  Estimated body mass index is 33.29 kg/m as calculated from the following:   Height as of this encounter: _0  (1.575 m).   Weight as of this encounter: 82.6 kg (182 lb).  Procedure(s) (LRB): INTRAMEDULLARY (IM) NAIL FEMORAL (Right)   Consults: None  HPI: Mallory Hines is a 66 year old female who underwent a right total knee arthroplasty 4 days ago.  She was at home doing well last night and went to put a shirt on and slipped, hitting the ground. She had immediate pain in her right thigh.  She presented to the emergency room today and was noted to have a midshaft femur fracture. This was not a periprosthetic fracture as it was in the midshaft of the femur.  She presents now for operative fixation.  Laboratory Data: Admission on 01/23/2017  Component Date Value Ref Range Status  . WBC 01/23/2017 9.2  4.0 - 10.5 K/uL Final  . RBC 01/23/2017 4.29  3.87 - 5.11 MIL/uL Final  . Hemoglobin 01/23/2017 12.6  12.0 - 15.0 g/dL Final  . HCT 01/23/2017 38.0  36.0 - 46.0 % Final  . MCV 01/23/2017 88.6  78.0 - 100.0 fL Final  . MCH 01/23/2017 29.4  26.0 - 34.0 pg Final  . MCHC 01/23/2017 33.2  30.0 - 36.0 g/dL Final  . RDW 01/23/2017 12.8  11.5 - 15.5 % Final  . Platelets 01/23/2017 312  150 - 400 K/uL Final  . Neutrophils Relative % 01/23/2017 83  % Final  . Neutro Abs 01/23/2017 7.6  1.7 - 7.7 K/uL Final  . Lymphocytes Relative 01/23/2017 8  % Final  . Lymphs Abs 01/23/2017 0.8  0.7 - 4.0 K/uL Final  .  Monocytes Relative 01/23/2017 9  % Final  . Monocytes Absolute 01/23/2017 0.8  0.1 - 1.0 K/uL Final  . Eosinophils Relative 01/23/2017 0  % Final  . Eosinophils Absolute 01/23/2017 0.0  0.0 - 0.7 K/uL Final  . Basophils Relative 01/23/2017 0  % Final  . Basophils Absolute 01/23/2017 0.0  0.0 - 0.1 K/uL Final  . Sodium 01/23/2017 136  135 - 145 mmol/L Final  . Potassium 01/23/2017 4.0  3.5 - 5.1 mmol/L Final  . Chloride 01/23/2017 99* 101 - 111 mmol/L Final  . CO2 01/23/2017 30  22 - 32 mmol/L Final  . Glucose, Bld 01/23/2017 141* 65 - 99 mg/dL Final  . BUN 01/23/2017 17  6 - 20 mg/dL Final  . Creatinine, Ser 01/23/2017 0.63  0.44 - 1.00 mg/dL Final  . Calcium 01/23/2017 8.8* 8.9 - 10.3 mg/dL Final  . GFR calc non Af Amer 01/23/2017 >60  >60 mL/min Final  . GFR calc Af Amer 01/23/2017 >60  >60 mL/min Final   Comment: (NOTE) The eGFR has been calculated using the CKD EPI equation. This calculation has not been validated in all clinical situations. eGFR's persistently <60 mL/min signify possible Chronic Kidney Disease.   . Anion gap 01/23/2017 7  5 - 15 Final  . ABO/RH(D) 01/23/2017 AB POS  Final  . Antibody Screen 01/23/2017 NEG   Final  . Sample Expiration 01/23/2017 01/26/2017   Final  . WBC 01/24/2017 9.8  4.0 - 10.5 K/uL Final  . RBC 01/24/2017 3.89  3.87 - 5.11 MIL/uL Final  . Hemoglobin 01/24/2017 11.6* 12.0 - 15.0 g/dL Final  . HCT 01/24/2017 34.8* 36.0 - 46.0 % Final  . MCV 01/24/2017 89.5  78.0 - 100.0 fL Final  . MCH 01/24/2017 29.8  26.0 - 34.0 pg Final  . MCHC 01/24/2017 33.3  30.0 - 36.0 g/dL Final  . RDW 01/24/2017 12.9  11.5 - 15.5 % Final  . Platelets 01/24/2017 354  150 - 400 K/uL Final  . Sodium 01/24/2017 137  135 - 145 mmol/L Final  . Potassium 01/24/2017 5.1  3.5 - 5.1 mmol/L Final   Comment: DELTA CHECK NOTED NO VISIBLE HEMOLYSIS   . Chloride 01/24/2017 99* 101 - 111 mmol/L Final  . CO2 01/24/2017 30  22 - 32 mmol/L Final  . Glucose, Bld 01/24/2017 156*  65 - 99 mg/dL Final  . BUN 01/24/2017 16  6 - 20 mg/dL Final  . Creatinine, Ser 01/24/2017 0.61  0.44 - 1.00 mg/dL Final  . Calcium 01/24/2017 8.6* 8.9 - 10.3 mg/dL Final  . GFR calc non Af Amer 01/24/2017 >60  >60 mL/min Final  . GFR calc Af Amer 01/24/2017 >60  >60 mL/min Final   Comment: (NOTE) The eGFR has been calculated using the CKD EPI equation. This calculation has not been validated in all clinical situations. eGFR's persistently <60 mL/min signify possible Chronic Kidney Disease.   . Anion gap 01/24/2017 8  5 - 15 Final  . WBC 01/25/2017 7.2  4.0 - 10.5 K/uL Final  . RBC 01/25/2017 3.53* 3.87 - 5.11 MIL/uL Final  . Hemoglobin 01/25/2017 10.5* 12.0 - 15.0 g/dL Final  . HCT 01/25/2017 31.6* 36.0 - 46.0 % Final  . MCV 01/25/2017 89.5  78.0 - 100.0 fL Final  . MCH 01/25/2017 29.7  26.0 - 34.0 pg Final  . MCHC 01/25/2017 33.2  30.0 - 36.0 g/dL Final  . RDW 01/25/2017 13.0  11.5 - 15.5 % Final  . Platelets 01/25/2017 298  150 - 400 K/uL Final  . Sodium 01/25/2017 138  135 - 145 mmol/L Final  . Potassium 01/25/2017 4.1  3.5 - 5.1 mmol/L Final   DELTA CHECK NOTED  . Chloride 01/25/2017 102  101 - 111 mmol/L Final  . CO2 01/25/2017 28  22 - 32 mmol/L Final  . Glucose, Bld 01/25/2017 124* 65 - 99 mg/dL Final  . BUN 01/25/2017 21* 6 - 20 mg/dL Final  . Creatinine, Ser 01/25/2017 0.55  0.44 - 1.00 mg/dL Final  . Calcium 01/25/2017 8.4* 8.9 - 10.3 mg/dL Final  . GFR calc non Af Amer 01/25/2017 >60  >60 mL/min Final  . GFR calc Af Amer 01/25/2017 >60  >60 mL/min Final   Comment: (NOTE) The eGFR has been calculated using the CKD EPI equation. This calculation has not been validated in all clinical situations. eGFR's persistently <60 mL/min signify possible Chronic Kidney Disease.   . Anion gap 01/25/2017 8  5 - 15 Final  . WBC 01/26/2017 7.2  4.0 - 10.5 K/uL Final  . RBC 01/26/2017 4.08  3.87 - 5.11 MIL/uL Final  . Hemoglobin 01/26/2017 12.0  12.0 - 15.0 g/dL Final  . HCT  01/26/2017 36.5  36.0 - 46.0 % Final  . MCV 01/26/2017 89.5  78.0 - 100.0 fL Final  .  MCH 01/26/2017 29.4  26.0 - 34.0 pg Final  . MCHC 01/26/2017 32.9  30.0 - 36.0 g/dL Final  . RDW 01/26/2017 12.9  11.5 - 15.5 % Final  . Platelets 01/26/2017 339  150 - 400 K/uL Final  Admission on 01/19/2017, Discharged on 01/21/2017  Component Date Value Ref Range Status  . WBC 01/20/2017 12.5* 4.0 - 10.5 K/uL Final  . RBC 01/20/2017 3.90  3.87 - 5.11 MIL/uL Final  . Hemoglobin 01/20/2017 11.6* 12.0 - 15.0 g/dL Final  . HCT 01/20/2017 34.9* 36.0 - 46.0 % Final  . MCV 01/20/2017 89.5  78.0 - 100.0 fL Final  . MCH 01/20/2017 29.7  26.0 - 34.0 pg Final  . MCHC 01/20/2017 33.2  30.0 - 36.0 g/dL Final  . RDW 01/20/2017 12.8  11.5 - 15.5 % Final  . Platelets 01/20/2017 272  150 - 400 K/uL Final  . Sodium 01/20/2017 136  135 - 145 mmol/L Final  . Potassium 01/20/2017 4.3  3.5 - 5.1 mmol/L Final  . Chloride 01/20/2017 103  101 - 111 mmol/L Final  . CO2 01/20/2017 27  22 - 32 mmol/L Final  . Glucose, Bld 01/20/2017 141* 65 - 99 mg/dL Final  . BUN 01/20/2017 17  6 - 20 mg/dL Final  . Creatinine, Ser 01/20/2017 0.59  0.44 - 1.00 mg/dL Final  . Calcium 01/20/2017 8.6* 8.9 - 10.3 mg/dL Final  . GFR calc non Af Amer 01/20/2017 >60  >60 mL/min Final  . GFR calc Af Amer 01/20/2017 >60  >60 mL/min Final   Comment: (NOTE) The eGFR has been calculated using the CKD EPI equation. This calculation has not been validated in all clinical situations. eGFR's persistently <60 mL/min signify possible Chronic Kidney Disease.   . Anion gap 01/20/2017 6  5 - 15 Final  . WBC 01/21/2017 12.3* 4.0 - 10.5 K/uL Final  . RBC 01/21/2017 4.20  3.87 - 5.11 MIL/uL Final  . Hemoglobin 01/21/2017 12.3  12.0 - 15.0 g/dL Final  . HCT 01/21/2017 37.6  36.0 - 46.0 % Final  . MCV 01/21/2017 89.5  78.0 - 100.0 fL Final  . MCH 01/21/2017 29.3  26.0 - 34.0 pg Final  . MCHC 01/21/2017 32.7  30.0 - 36.0 g/dL Final  . RDW 01/21/2017 12.7   11.5 - 15.5 % Final  . Platelets 01/21/2017 274  150 - 400 K/uL Final  . Sodium 01/21/2017 136  135 - 145 mmol/L Final  . Potassium 01/21/2017 4.0  3.5 - 5.1 mmol/L Final  . Chloride 01/21/2017 99* 101 - 111 mmol/L Final  . CO2 01/21/2017 28  22 - 32 mmol/L Final  . Glucose, Bld 01/21/2017 128* 65 - 99 mg/dL Final  . BUN 01/21/2017 14  6 - 20 mg/dL Final  . Creatinine, Ser 01/21/2017 0.57  0.44 - 1.00 mg/dL Final  . Calcium 01/21/2017 8.8* 8.9 - 10.3 mg/dL Final  . GFR calc non Af Amer 01/21/2017 >60  >60 mL/min Final  . GFR calc Af Amer 01/21/2017 >60  >60 mL/min Final   Comment: (NOTE) The eGFR has been calculated using the CKD EPI equation. This calculation has not been validated in all clinical situations. eGFR's persistently <60 mL/min signify possible Chronic Kidney Disease.   Georgiann Hahn gap 01/21/2017 9  5 - 15 Final  Hospital Outpatient Visit on 01/12/2017  Component Date Value Ref Range Status  . aPTT 01/12/2017 29  24 - 36 seconds Final  . WBC 01/12/2017 4.9  4.0 - 10.5 K/uL  Final  . RBC 01/12/2017 4.37  3.87 - 5.11 MIL/uL Final  . Hemoglobin 01/12/2017 13.0  12.0 - 15.0 g/dL Final  . HCT 01/12/2017 38.9  36.0 - 46.0 % Final  . MCV 01/12/2017 89.0  78.0 - 100.0 fL Final  . MCH 01/12/2017 29.7  26.0 - 34.0 pg Final  . MCHC 01/12/2017 33.4  30.0 - 36.0 g/dL Final  . RDW 01/12/2017 12.6  11.5 - 15.5 % Final  . Platelets 01/12/2017 271  150 - 400 K/uL Final  . Sodium 01/12/2017 141  135 - 145 mmol/L Final  . Potassium 01/12/2017 4.3  3.5 - 5.1 mmol/L Final  . Chloride 01/12/2017 106  101 - 111 mmol/L Final  . CO2 01/12/2017 28  22 - 32 mmol/L Final  . Glucose, Bld 01/12/2017 156* 65 - 99 mg/dL Final  . BUN 01/12/2017 18  6 - 20 mg/dL Final  . Creatinine, Ser 01/12/2017 0.63  0.44 - 1.00 mg/dL Final  . Calcium 01/12/2017 9.2  8.9 - 10.3 mg/dL Final  . Total Protein 01/12/2017 7.4  6.5 - 8.1 g/dL Final  . Albumin 01/12/2017 4.3  3.5 - 5.0 g/dL Final  . AST 01/12/2017 25  15  - 41 U/L Final  . ALT 01/12/2017 23  14 - 54 U/L Final  . Alkaline Phosphatase 01/12/2017 66  38 - 126 U/L Final  . Total Bilirubin 01/12/2017 0.8  0.3 - 1.2 mg/dL Final  . GFR calc non Af Amer 01/12/2017 >60  >60 mL/min Final  . GFR calc Af Amer 01/12/2017 >60  >60 mL/min Final   Comment: (NOTE) The eGFR has been calculated using the CKD EPI equation. This calculation has not been validated in all clinical situations. eGFR's persistently <60 mL/min signify possible Chronic Kidney Disease.   . Anion gap 01/12/2017 7  5 - 15 Final  . Prothrombin Time 01/12/2017 13.6  11.4 - 15.2 seconds Final  . INR 01/12/2017 1.05   Final  . ABO/RH(D) 01/12/2017 AB POS   Final  . Antibody Screen 01/12/2017 NEG   Final  . Sample Expiration 01/12/2017 01/22/2017   Final  . Extend sample reason 01/12/2017 NO TRANSFUSIONS OR PREGNANCY IN THE PAST 3 MONTHS   Final  . MRSA, PCR 01/12/2017 NEGATIVE  NEGATIVE Final  . Staphylococcus aureus 01/12/2017 POSITIVE* NEGATIVE Final   Comment: (NOTE) The Xpert SA Assay (FDA approved for NASAL specimens in patients 69 years of age and older), is one component of a comprehensive surveillance program. It is not intended to diagnose infection nor to guide or monitor treatment.   . ABO/RH(D) 01/12/2017 AB POS   Final     X-Rays:Dg Knee 1-2 Views Right  Result Date: 01/23/2017 CLINICAL DATA:  Recent total knee replacement, now with right leg pain. EXAM: RIGHT KNEE - 1-2 VIEW COMPARISON:  None. FINDINGS: There is an a obliquely oriented midshaft femoral fracture with approximately 9.5 cm of foreshortening and posterior angulation. No definitive intra-articular extension. Post left total knee replacement. No definite evidence of hardware failure or loosening given obliquity. There is a minimal amount of residual intra-articular air, an expected finding given recent total knee replacement. No definite radiopaque foreign body. IMPRESSION: 1. Acute obliquely oriented midshaft  femoral fracture with foreshortening. 2. Post total knee replacement without evidence of hardware failure or loosening. Electronically Signed   By: Sandi Mariscal M.D.   On: 01/23/2017 14:41   Dg C-arm 1-60 Min-no Report  Result Date: 01/23/2017 Fluoroscopy was utilized by the requesting  physician.  No radiographic interpretation.   Dg Hip Operative Unilat W Or W/o Pelvis Right  Result Date: 01/23/2017 CLINICAL DATA:  Intramedullary nail placement in the right femur. EXAM: OPERATIVE RIGHT FEMUR ; DG C-ARM 1-60 MIN-NO REPORT COMPARISON:  Right hip and right femur 01/23/2017 FLUOROSCOPY TIME:  Fluoroscopy Time:  43 seconds Radiation Exposure Index (if provided by the fluoroscopic device): 6.73 mGy Number of Acquired Spot Images: 6 FINDINGS: Intraoperative fluoroscopy is obtained for surgical control purposes. Spot fluoroscopic images obtained demonstrate placement of an intramedullary rod across to an oblique fracture of the midshaft right femur. Near-anatomic alignment and position of the right femur is demonstrated with the rod in place. A single proximal and 2 distal locking screws are placed. There is a pre-existing right knee replacement. IMPRESSION: Intraoperative fluoroscopy obtained for surgical control purposes demonstrating placement of intramedullary rod fixation of a fracture of the midshaft right femur. Electronically Signed   By: Lucienne Capers M.D.   On: 01/23/2017 21:14   Dg Hip Unilat W Or Wo Pelvis 2-3 Views Right  Result Date: 01/23/2017 CLINICAL DATA:  Recent total knee replacement now with right leg pain. EXAM: DG HIP (WITH OR WITHOUT PELVIS) 2-3V RIGHT COMPARISON:  Right femur radiographs - earlier same day FINDINGS: No fracture or dislocation. Right hip joint spaces appear preserved. No evidence avascular necrosis. Limited visualization the pelvis and contralateral left hip is normal. Punctate phleboliths overlies the right hemipelvis. Regional soft tissues appear otherwise normal.  IMPRESSION: 1. Known midshaft femoral fracture is not imaged on the present examination. Correlation with dedicated right femur radiographs is recommended. 2. No proximal femoral or right hip fracture. Electronically Signed   By: Sandi Mariscal M.D.   On: 01/23/2017 14:43    EKG: Orders placed or performed during the hospital encounter of 07/08/16  . ED EKG  . ED EKG  . EKG 12-Lead  . EKG 12-Lead  . EKG      Hospital Course: Patient was admitted to Hospital for the above states problem after xrays proved positive for a fracture of the right femur. She had just undergone surgery for the right total knee first of the week. Following appropriate workup and evaluation, the patient was taken to the OR and underwent the above state procedure without complications.  Patient tolerated the procedure well and was later transferred to the recovery room and then to the orthopaedic floor for postoperative care.  They were given PO and IV analgesics for pain control following their surgery.  They were given 24 hours of postoperative antibiotics of  Anti-infectives    Start     Dose/Rate Route Frequency Ordered Stop   01/24/17 0200  ceFAZolin (ANCEF) IVPB 2g/100 mL premix     2 g 200 mL/hr over 30 Minutes Intravenous Every 6 hours 01/23/17 2055 01/24/17 1005   01/23/17 1700  ceFAZolin (ANCEF) IVPB 2g/100 mL premix     2 g 200 mL/hr over 30 Minutes Intravenous  Once 01/23/17 1647 01/23/17 1734   01/23/17 1646  ceFAZolin (ANCEF) 2-4 GM/100ML-% IVPB    Comments:  [place], Ron   : cabinet override      01/23/17 1646 01/23/17 1724     and started on DVT prophylaxis in the form of Xarelto.   PT and OT were ordered for gait training and ambulation.  The patient's weight bearing status was 25-50% PWB to the right leg with therapy. Discharge planning was consulted to help with postop disposition and equipment needs.   Social  worker was also consulted due to the likelihood that the patient would need SNF.  Patient had  a decent night on the evening of surgery.  They started to get up OOB with therapy on day one.  Hemovac drain was pulled without difficulty.  The knee immobilizer was continued for support while the patient was up.  They continued to work with therapy into day two.  Dressing was changed on day two and the incision was healing well.  By day three, the patient had progressed with therapy and meeting their goals.  Incision was healing well.  Patient was seen in rounds and was ready to go to the SNF - [facility].  Diet: Cardiac diet Activity:PWB Knee Immobilizer when up weight bearing.  May have off in bed. Follow-up:in 2 weeks Disposition - Skilled nursing facility Discharged Condition: stable  Discharge Instructions    Call MD / Call 911    Complete by:  As directed    If you experience chest pain or shortness of breath, CALL 911 and be transported to the hospital emergency room.  If you develope a fever above 101 F, pus (white drainage) or increased drainage or redness at the wound, or calf pain, call your surgeon's office.   Change dressing    Complete by:  As directed    Change dressing daily with sterile 4 x 4 inch gauze dressing and apply TED hose. Do not submerge the incision under water.   Constipation Prevention    Complete by:  As directed    Drink plenty of fluids.  Prune juice may be helpful.  You may use a stool softener, such as Colace (over the counter) 100 mg twice a day.  Use MiraLax (over the counter) for constipation as needed.   Diet - low sodium heart healthy    Complete by:  As directed    Discharge instructions    Complete by:  As directed    Take Xarelto for two and a half more weeks, then discontinue Xarelto. Once the patient has completed the blood thinner regimen, then take a Baby 81 mg Aspirin daily for three more weeks.   Pick up stool softner and laxative for home use following surgery while on pain medications. Do not submerge incision under water. Please use  good hand washing techniques while changing dressing each day. May shower starting three days after surgery. Please use a clean towel to pat the incision dry following showers. Continue to use ice for pain and swelling after surgery. Do not use any lotions or creams on the incision until instructed by your surgeon.  Wear both TED hose on both legs during the day every day for three weeks, but may remove the TED hose at night at home.  Postoperative Constipation Protocol  Constipation - defined medically as fewer than three stools per week and severe constipation as less than one stool per week.  One of the most common issues patients have following surgery is constipation.  Even if you have a regular bowel pattern at home, your normal regimen is likely to be disrupted due to multiple reasons following surgery.  Combination of anesthesia, postoperative narcotics, change in appetite and fluid intake all can affect your bowels.  In order to avoid complications following surgery, here are some recommendations in order to help you during your recovery period.  Colace (docusate) - Pick up an over-the-counter form of Colace or another stool softener and take twice a day as long as you are requiring  postoperative pain medications.  Take with a full glass of water daily.  If you experience loose stools or diarrhea, hold the colace until you stool forms back up.  If your symptoms do not get better within 1 week or if they get worse, check with your doctor.  Dulcolax (bisacodyl) - Pick up over-the-counter and take as directed by the product packaging as needed to assist with the movement of your bowels.  Take with a full glass of water.  Use this product as needed if not relieved by Colace only.   MiraLax (polyethylene glycol) - Pick up over-the-counter to have on hand.  MiraLax is a solution that will increase the amount of water in your bowels to assist with bowel movements.  Take as directed and can mix  with a glass of water, juice, soda, coffee, or tea.  Take if you go more than two days without a movement. Do not use MiraLax more than once per day. Call your doctor if you are still constipated or irregular after using this medication for 7 days in a row.  If you continue to have problems with postoperative constipation, please contact the office for further assistance and recommendations.  If you experience "the worst abdominal pain ever" or develop nausea or vomiting, please contact the office immediatly for further recommendations for treatment.  Knee Immobilizer to the right leg when up OOB and weight bearing.  May have off in bed.  CPM - Zero to 40 degrees, two session per day for two hours each.  Increase 10 degrees daily up to 90 degrees.    Do not put a pillow under the knee. Place it under the heel.    Complete by:  As directed    Do not sit on low chairs, stoools or toilet seats, as it may be difficult to get up from low surfaces    Complete by:  As directed    Driving restrictions    Complete by:  As directed    No driving until released by the physician.   Increase activity slowly as tolerated    Complete by:  As directed    Lifting restrictions    Complete by:  As directed    No lifting until released by the physician.   Partial weight bearing    Complete by:  As directed    % Body Weight:  25-50%   Laterality:  right   Extremity:  Lower   Patient may shower    Complete by:  As directed    You may shower without a dressing once there is no drainage.  Do not wash over the wound.  If drainage remains, do not shower until drainage stops.   TED hose    Complete by:  As directed    Use stockings (TED hose) for 3 weeks on both leg(s).  You may remove them at night for sleeping.     Allergies as of 01/26/2017      Reactions   Amlodipine Other (See Comments)   Leg cramps   Celecoxib Other (See Comments)   REACTION: whelps   Codeine Nausea And Vomiting   REACTION: does  not like drug   Meloxicam Other (See Comments)   REACTION: whelps   Nsaids Other (See Comments)   REACTION: whelps   Adhesive [tape] Rash      Medication List    STOP taking these medications   mupirocin ointment 2 % Commonly known as:  [employer]  TAKE these medications   acetaminophen 325 MG tablet Commonly known as:  TYLENOL Take 2 tablets (650 mg total) by mouth every 6 (six) hours as needed for mild pain (or Fever >/= 101).   amLODipine 10 MG tablet Commonly known as:  NORVASC TAKE 1 TABLET (10 MG TOTAL) BY MOUTH DAILY.   benazepril 20 MG tablet Commonly known as:  LOTENSIN TAKE 1 TABLET (20 MG TOTAL) BY MOUTH DAILY. OVERDUE FOR FOLLOW-UP APPT MUST SEE PROVIDER FOR REFILLS   bisacodyl 10 MG suppository Commonly known as:  DULCOLAX Place 1 suppository (10 mg total) rectally daily as needed for moderate constipation.   diphenhydrAMINE 25 mg capsule Commonly known as:  BENADRYL Take 25 mg by mouth daily as needed for itching or allergies.   docusate sodium 100 MG capsule Commonly known as:  COLACE Take 1 capsule (100 mg total) by mouth 2 (two) times daily.   HYDROmorphone 2 MG tablet Commonly known as:  DILAUDID Take 1-2 tablets (2-4 mg total) by mouth every 4 (four) hours as needed for severe pain.   loratadine 10 MG tablet Commonly known as:  CLARITIN Take 10 mg by mouth daily as needed for allergies.   methocarbamol 500 MG tablet Commonly known as:  ROBAXIN Take 1 tablet (500 mg total) by mouth every 6 (six) hours as needed for muscle spasms.   mometasone 0.1 % cream Commonly known as:  ELOCON Apply 1 application topically at bedtime.   oxyCODONE-acetaminophen 5-325 MG tablet Commonly known as:  PERCOCET/ROXICET Take 1-2 tablets by mouth every 4 (four) hours as needed for moderate pain or severe pain.   polyethylene glycol packet Commonly known as:  MIRALAX / GLYCOLAX Take 17 g by mouth daily as needed for mild constipation.   rivaroxaban 10 MG  Tabs tablet Commonly known as:  XARELTO Take 1 tablet (10 mg total) by mouth daily. Take Xarelto for two and a half more weeks following discharge from the hospital, then discontinue Xarelto. Once the patient has completed the blood thinner regimen, then take a Baby 81 mg Aspirin daily for three more weeks. What changed:  when to take this   sodium phosphate 7-19 GM/118ML Enem Place 133 mLs (1 enema total) rectally once as needed for severe constipation.   traMADol 50 MG tablet Commonly known as:  ULTRAM Take 1-2 tablets (50-100 mg total) by mouth every 6 (six) hours as needed for moderate pain.            Discharge Care Instructions        Start     Ordered   01/26/17 0000  acetaminophen (TYLENOL) 325 MG tablet  Every 6 hours PRN    Question:  Supervising Provider  Answer:  Gaynelle Arabian   01/26/17 0803   01/26/17 0000  bisacodyl (DULCOLAX) 10 MG suppository  Daily PRN    Question:  Supervising Provider  Answer:  Gaynelle Arabian   01/26/17 0803   01/26/17 0000  docusate sodium (COLACE) 100 MG capsule  2 times daily    Question:  Supervising Provider  Answer:  Gaynelle Arabian   01/26/17 0803   01/26/17 0000  HYDROmorphone (DILAUDID) 2 MG tablet  Every 4 hours PRN    Question:  Supervising Provider  Answer:  Gaynelle Arabian   01/26/17 0803   01/26/17 0000  methocarbamol (ROBAXIN) 500 MG tablet  Every 6 hours PRN    Question:  Supervising Provider  Answer:  Gaynelle Arabian   01/26/17 0803   01/26/17 0000  oxyCODONE-acetaminophen (PERCOCET/ROXICET) 5-325 MG tablet  Every 4 hours PRN    Question:  Supervising Provider  Answer:  Gaynelle Arabian   01/26/17 0803   01/26/17 0000  polyethylene glycol (MIRALAX / Floria Raveling) packet  Daily PRN    Question:  Supervising Provider  Answer:  Gaynelle Arabian   01/26/17 0803   01/26/17 0000  rivaroxaban (XARELTO) 10 MG TABS tablet  Daily    Question:  Supervising Provider  Answer:  Gaynelle Arabian   01/26/17 0803   01/26/17 0000  sodium phosphate  (FLEET) 7-19 GM/118ML ENEM  Once PRN    Question:  Supervising Provider  Answer:  Gaynelle Arabian   01/26/17 0803   01/26/17 0000  traMADol (ULTRAM) 50 MG tablet  Every 6 hours PRN    Question:  Supervising Provider  Answer:  Gaynelle Arabian   01/26/17 0803   01/26/17 0000  Call MD / Call 911    Comments:  If you experience chest pain or shortness of breath, CALL 911 and be transported to the hospital emergency room.  If you develope a fever above 101 F, pus (white drainage) or increased drainage or redness at the wound, or calf pain, call your surgeon's office.   01/26/17 0810   01/26/17 0000  Discharge instructions    Comments:  Take Xarelto for two and a half more weeks, then discontinue Xarelto. Once the patient has completed the blood thinner regimen, then take a Baby 81 mg Aspirin daily for three more weeks.   Pick up stool softner and laxative for home use following surgery while on pain medications. Do not submerge incision under water. Please use good hand washing techniques while changing dressing each day. May shower starting three days after surgery. Please use a clean towel to pat the incision dry following showers. Continue to use ice for pain and swelling after surgery. Do not use any lotions or creams on the incision until instructed by your surgeon.  Wear both TED hose on both legs during the day every day for three weeks, but may remove the TED hose at night at home.  Postoperative Constipation Protocol  Constipation - defined medically as fewer than three stools per week and severe constipation as less than one stool per week.  One of the most common issues patients have following surgery is constipation.  Even if you have a regular bowel pattern at home, your normal regimen is likely to be disrupted due to multiple reasons following surgery.  Combination of anesthesia, postoperative narcotics, change in appetite and fluid intake all can affect your bowels.  In order to  avoid complications following surgery, here are some recommendations in order to help you during your recovery period.  Colace (docusate) - Pick up an over-the-counter form of Colace or another stool softener and take twice a day as long as you are requiring postoperative pain medications.  Take with a full glass of water daily.  If you experience loose stools or diarrhea, hold the colace until you stool forms back up.  If your symptoms do not get better within 1 week or if they get worse, check with your doctor.  Dulcolax (bisacodyl) - Pick up over-the-counter and take as directed by the product packaging as needed to assist with the movement of your bowels.  Take with a full glass of water.  Use this product as needed if not relieved by Colace only.   MiraLax (polyethylene glycol) - Pick up over-the-counter to have on hand.  MiraLax is  a solution that will increase the amount of water in your bowels to assist with bowel movements.  Take as directed and can mix with a glass of water, juice, soda, coffee, or tea.  Take if you go more than two days without a movement. Do not use MiraLax more than once per day. Call your doctor if you are still constipated or irregular after using this medication for 7 days in a row.  If you continue to have problems with postoperative constipation, please contact the office for further assistance and recommendations.  If you experience "the worst abdominal pain ever" or develop nausea or vomiting, please contact the office immediatly for further recommendations for treatment.   01/26/17 0810   01/26/17 0000  Diet - low sodium heart healthy     01/26/17 0810   01/26/17 0000  Constipation Prevention    Comments:  Drink plenty of fluids.  Prune juice may be helpful.  You may use a stool softener, such as Colace (over the counter) 100 mg twice a day.  Use MiraLax (over the counter) for constipation as needed.   01/26/17 0810   01/26/17 0000  Increase activity slowly as  tolerated     01/26/17 0810   01/26/17 0000  Patient may shower    Comments:  You may shower without a dressing once there is no drainage.  Do not wash over the wound.  If drainage remains, do not shower until drainage stops.   01/26/17 0810   01/26/17 0000  Driving restrictions    Comments:  No driving until released by the physician.   01/26/17 0810   01/26/17 0000  Lifting restrictions    Comments:  No lifting until released by the physician.   01/26/17 0810   01/26/17 0000  TED hose    Comments:  Use stockings (TED hose) for 3 weeks on both leg(s).  You may remove them at night for sleeping.   01/26/17 0810   01/26/17 0000  Change dressing    Comments:  Change dressing daily with sterile 4 x 4 inch gauze dressing and apply TED hose. Do not submerge the incision under water.   01/26/17 0810   01/26/17 0000  Do not put a pillow under the knee. Place it under the heel.     01/26/17 0810   01/26/17 0000  Do not sit on low chairs, stoools or toilet seats, as it may be difficult to get up from low surfaces     01/26/17 0810   01/26/17 0000  Partial weight bearing    Question Answer Comment  % Body Weight 25-50%   Laterality right   Extremity Lower      01/26/17 0810     Follow-up Information    Gaynelle Arabian, MD. Schedule an appointment as soon as possible for a visit on 02/10/2017.   Specialty:  Orthopedic Surgery Contact information: [address] [place] [address] [phone]           Signed: Arlee Muslim, PA-C Orthopaedic Surgery 01/26/2017, 8:12 AM

## 2017-01-26 NOTE — Clinical Social Work Placement (Signed)
   CLINICAL SOCIAL WORK PLACEMENT  NOTE  Date:  01/26/2017  Patient Details  Name: [name] MRN: [id number] Date of Birth: 1951/01/12  Clinical Social Work is seeking post-discharge placement for this patient at the [place] level of care (*CSW will initial, date and re-position this form in  chart as items are completed):  Yes   Patient/family provided with [facility] Work Department's list of facilities offering this level of care within the geographic area requested by the patient (or if unable, by the patient's family).  Yes   Patient/family informed of their freedom to choose among providers that offer the needed level of care, that participate in Medicare, Medicaid or managed care program needed by the patient, have an available bed and are willing to accept the patient.  Yes   Patient/family informed of [facility]'s ownership interest in [facility] and [facility], as well as of the fact that they are under no obligation to receive care at these facilities.  PASRR submitted to EDS on       PASRR number received on       Existing PASRR number confirmed on 01/26/17     FL2 transmitted to all facilities in geographic area requested by pt/family on       FL2 transmitted to all facilities within larger geographic area on       Patient informed that his/her managed care company has contracts with or will negotiate with certain facilities, including the following:  [facility]         Patient/family informed of bed offers received.  Patient chooses bed at [facility]     Physician recommends and patient chooses bed at [facility]    Patient to be transferred to [facility] on 01/26/17.  Patient to be transferred to facility by Spouse     Patient family notified on 01/26/17 of transfer.  Name of family member notified:  Spouse     PHYSICIAN       Additional  Comment:    _______________________________________________ Lia Hopping, LCSW 01/26/2017, 2:57 PM

## 2017-05-29 ENCOUNTER — Ambulatory Visit (INDEPENDENT_AMBULATORY_CARE_PROVIDER_SITE_OTHER): Payer: 59 | Admitting: Obstetrics and Gynecology

## 2017-05-29 ENCOUNTER — Encounter: Payer: Self-pay | Admitting: Obstetrics and Gynecology

## 2017-05-29 ENCOUNTER — Other Ambulatory Visit: Payer: Self-pay

## 2017-05-29 VITALS — BP 184/92 | HR 88 | Resp 16 | Ht 62.0 in | Wt 174.8 lb

## 2017-05-29 DIAGNOSIS — N6315 Unspecified lump in the right breast, overlapping quadrants: Secondary | ICD-10-CM

## 2017-05-29 DIAGNOSIS — N6310 Unspecified lump in the right breast, unspecified quadrant: Secondary | ICD-10-CM | POA: Diagnosis not present
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
## 2017-05-29 NOTE — Patient Instructions (Signed)

## 2017-06-03 ENCOUNTER — Ambulatory Visit
Admission: RE | Admit: 2017-06-03 | Discharge: 2017-06-03 | Disposition: A | Discharge status: Home / Self Care | Payer: Medicare Other | Source: Ambulatory Visit | Attending: Obstetrics and Gynecology | Admitting: Obstetrics and Gynecology

## 2017-06-03 ENCOUNTER — Ambulatory Visit
Admission: RE | Admit: 2017-06-03 | Discharge: 2017-06-03 | Disposition: A | Discharge status: Home / Self Care | Payer: 59 | Source: Ambulatory Visit | Attending: Obstetrics and Gynecology | Admitting: Obstetrics and Gynecology

## 2017-06-03 DIAGNOSIS — N6315 Unspecified lump in the right breast, overlapping quadrants: Secondary | ICD-10-CM

## 2017-06-03 DIAGNOSIS — N6310 Unspecified lump in the right breast, unspecified quadrant: Principal | ICD-10-CM

## 2017-07-09 ENCOUNTER — Ambulatory Visit: Payer: 59 | Admitting: Obstetrics and Gynecology

## 2017-08-13 ENCOUNTER — Ambulatory Visit: Payer: 59 | Admitting: Obstetrics and Gynecology

## 2017-10-20 DIAGNOSIS — M545 Low back pain, unspecified: Secondary | ICD-10-CM | POA: Insufficient documentation

## 2017-10-22 ENCOUNTER — Encounter: Payer: Self-pay | Admitting: Gastroenterology

## 2018-01-05 DIAGNOSIS — R059 Cough, unspecified: Secondary | ICD-10-CM | POA: Insufficient documentation

## 2018-01-05 DIAGNOSIS — J189 Pneumonia, unspecified organism: Secondary | ICD-10-CM | POA: Insufficient documentation

## 2018-02-09 ENCOUNTER — Ambulatory Visit (INDEPENDENT_AMBULATORY_CARE_PROVIDER_SITE_OTHER): Payer: 59 | Admitting: Internal Medicine

## 2018-02-09 ENCOUNTER — Encounter: Payer: Self-pay | Admitting: Internal Medicine

## 2018-02-09 VITALS — BP 158/82 | HR 79 | Ht 62.0 in | Wt 188.0 lb

## 2018-02-09 DIAGNOSIS — I10 Essential (primary) hypertension: Secondary | ICD-10-CM

## 2018-02-09 DIAGNOSIS — R058 Other specified cough: Secondary | ICD-10-CM

## 2018-02-09 DIAGNOSIS — R05 Cough: Secondary | ICD-10-CM | POA: Diagnosis not present

## 2018-02-09 NOTE — Progress Notes (Signed)
Mallory Hines, female    DOB: 1950-08-15,    MRN: [id number]    Brief patient profile:  61 yowf never smoker with cough p coffee x  Around 1999 eval by [facility] for ? Allergy to celebrex with neg findings self-referred for cough 02/09/2018 on ACEi since "very long time"      02/09/2018  Pulmonary / 1st office eval  Chief Complaint  Patient presents with  . Pulmonary Consult    Self referral. Pt had PNA in Oct 2019. At that time she was coughing alot. Her cough has recently resolved.   labor day weekend 2019 onset severe cough > min productive clear assoc with R ant cp > [place] did cxr w/in a week  of onset dx pna rx levaquin / tessalon >  X 2 rounds > cp and cough much better and by Feb 04 2018 "inflammed lung" rx prednisone and cough is much better and now just has "nasal drainage down throat " but s obvious other nasal symptoms and no worse hs and continues to cough = baseline p am coffee Sleeps flat on no  Pillows Not limited by breathing from desired activities     No obvious day to day or daytime variability or assoc excess/ purulent sputum or mucus plugs or hemoptysis or cp or chest tightness, subjective wheeze or overt sinus or hb symptoms.   Sleeping as above  without nocturnal  or early am exacerbation  of respiratory  c/o's or need for noct saba. Also denies any obvious fluctuation of symptoms with weather or environmental changes or other aggravating or alleviating factors except as outlined above   No unusual exposure hx or h/o childhood pna/ asthma or knowledge of premature birth.  Current Allergies, Complete Past Medical History, Past Surgical History, Family History, and Social History were reviewed in [employer] record.  ROS  The following are not active complaints unless bolded Hoarseness, sore throat, dysphagia, dental problems, itching, sneezing,  nasal congestion or discharge of excess mucus or purulent secretions, ear ache,   fever, chills,  sweats, unintended wt loss or wt gain, classically pleuritic or exertional cp,  orthopnea pnd or arm/hand swelling  or leg swelling, presyncope, palpitations, abdominal pain, anorexia, nausea, vomiting, diarrhea  or change in bowel habits or change in bladder habits, change in stools or change in urine, dysuria, hematuria,  rash, arthralgias, visual complaints, headache, numbness, weakness or ataxia or problems with walking or coordination,  change in mood or  memory.            Past Medical History:  Diagnosis Date  . Diverticulosis of colon (without mention of hemorrhage)   . Hypertension   . Skin cancer [id number]   right calf, face  . Varicose veins     Outpatient Medications Prior to Visit  Medication Sig Dispense Refill  . benazepril (LOTENSIN) 20 MG tablet TAKE 1 TABLET (20 MG TOTAL) BY MOUTH DAILY. OVERDUE FOR FOLLOW-UP APPT MUST SEE PROVIDER FOR REFILLS 30 tablet 0  . benzonatate (TESSALON) 100 MG capsule Take 1 capsule by mouth 3 (three) times daily as needed.  0  . predniSONE (STERAPRED UNI-PAK 21 TAB) 10 MG (21) TBPK tablet As directed x 6 days  0              Objective:     BP (!) 158/82 (BP Location: Left Arm, Cuff Size: Normal)   Pulse 79   Ht 5\' 2"  (1.575 m)   Wt 188 lb (  85.3 kg)   LMP 05/05/2001 (Approximate)   SpO2 96%   BMI 34.39 kg/m   SpO2: 96 %  RA  Animated very pleasant wf / occ throat clearing   HEENT: nl dentition, turbinates bilaterally, and oropharynx which is pristine without any pnd at all. Nl external ear canals without cough reflex   NECK :  without JVD/Nodes/TM/ nl carotid upstrokes bilaterally   LUNGS: no acc muscle use,  Nl contour chest which is clear to A and P bilaterally without cough on insp or exp maneuvers   CV:  RRR  no s3 or murmur or increase in P2, and no edema   ABD:  soft and nontender with nl inspiratory excursion in the supine position. No bruits or organomegaly appreciated, bowel sounds nl  MS:  Nl gait/ ext warm  without deformities, calf tenderness, cyanosis or clubbing No obvious joint restrictions   SKIN: warm and dry without lesions    NEURO:  alert, approp, nl sensorium with  no motor or cerebellar deficits apparent.    cxr's not on file       Assessment   Upper airway cough syndrome Since 1999 p am coffee - rec trial off acei  02/09/2018   Upper airway cough syndrome (previously labeled PNDS),  is so named because it's frequently impossible to sort out how much is  CR/sinusitis with freq throat clearing (which can be related to primary GERD)   vs  causing  secondary (" extra esophageal")  GERD from wide swings in gastric pressure that occur with throat clearing, often  promoting self use of mint and menthol lozenges that reduce the lower esophageal sphincter tone and exacerbate the problem further in a cyclical fashion.   These are the same pts (now being labeled as having "irritable larynx syndrome" by some cough centers) who not infrequently have a history of having failed to tolerate ace inhibitors,  dry powder inhalers or biphosphonates or report having atypical/extraesophageal reflux symptoms that don't respond to standard doses of PPI  and are easily confused as having aecopd or asthma flares by even experienced allergists/ pulmonologists (myself included).    >>>> Rec consider  try off acei x 6 weeks and return if not 100% better - no point in doing further w/u if cxr is nl and no new symptoms vs baseline (1999 onset chronic min am cough)     Essential hypertension In the best review of chronic cough to date ( NEJM 2016 375 1544-1551) ,  ACEi are now felt to cause cough in up to  20% of pts which is a 4 fold increase from previous reports and does not include the variety of non-specific complaints we see in pulmonary clinic in pts on ACEi but previously attributed to another dx like  Copd/asthma and  include PNDS, throat and chest congestion, "bronchitis", unexplained dyspnea and noct  "strangling" sensations, and hoarseness, but also  atypical /refractory GERD symptoms like dysphagia and "bad heartburn"   The only way I know  to prove this is not an "ACEi Case" is a trial off ACEi x a minimum of 6 weeks then regroup with ov here but since this is a chronic and non-lethal issue I defer completely to Dr Ardeth Perfect if/ when to change acei to alternative.   Pulmonary f/u can be prn     Total time devoted to counseling  > 50 % of initial 60 min office visit:  review case with pt/ discussion of options/alternatives/ personally creating written customized  instructions  in presence of pt  then going over those specific  Instructions directly with the pt including how to use all of the meds but in particular covering each new medication in detail and the difference between the maintenance= "automatic" meds and the prns using an action plan format for the latter (If this problem/symptom => do that organization reading Left to right).  Please see AVS from this visit for a full list of these instructions which I personally wrote for this pt and  are unique to this visit.      Christinia Gully, MD 02/09/2018

## 2018-02-09 NOTE — Patient Instructions (Signed)
Your chronic cough is likely due to ACE inhibitors but is not a danger to you but may confuse the interpretation of your symptoms in the event any respiratory illness.

## 2018-02-10 ENCOUNTER — Encounter: Payer: Self-pay | Admitting: Internal Medicine

## 2018-02-10 DIAGNOSIS — R05 Cough: Secondary | ICD-10-CM | POA: Insufficient documentation

## 2018-02-10 DIAGNOSIS — R058 Other specified cough: Secondary | ICD-10-CM | POA: Insufficient documentation

## 2018-02-10 NOTE — Assessment & Plan Note (Addendum)
Since 1999 p am coffee - rec trial off acei  02/09/2018   Upper airway cough syndrome (previously labeled PNDS),  is so named because it's frequently impossible to sort out how much is  CR/sinusitis with freq throat clearing (which can be related to primary GERD)   vs  causing  secondary (" extra esophageal")  GERD from wide swings in gastric pressure that occur with throat clearing, often  promoting self use of mint and menthol lozenges that reduce the lower esophageal sphincter tone and exacerbate the problem further in a cyclical fashion.   These are the same pts (now being labeled as having "irritable larynx syndrome" by some cough centers) who not infrequently have a history of having failed to tolerate ace inhibitors,  dry powder inhalers or biphosphonates or report having atypical/extraesophageal reflux symptoms that don't respond to standard doses of PPI  and are easily confused as having aecopd or asthma flares by even experienced allergists/ pulmonologists (myself included).    >>>> Rec consider  try off acei x 6 weeks and return if not 100% better - no point in doing further w/u if cxr is nl and no new symptoms vs baseline (1999 onset chronic min am cough)

## 2018-02-10 NOTE — Assessment & Plan Note (Signed)
In the best review of chronic cough to date ( NEJM 2016 375 [id number]) ,  ACEi are now felt to cause cough in up to  20% of pts which is a 4 fold increase from previous reports and does not include the variety of non-specific complaints we see in pulmonary clinic in pts on ACEi but previously attributed to another dx like  Copd/asthma and  include PNDS, throat and chest congestion, "bronchitis", unexplained dyspnea and noct "strangling" sensations, and hoarseness, but also  atypical /refractory GERD symptoms like dysphagia and "bad heartburn"   The only way I know  to prove this is not an "ACEi Case" is a trial off ACEi x a minimum of 6 weeks then regroup with ov here but since this is a chronic and non-lethal issue I defer completely to Dr Ardeth Perfect if/ when to change acei to alternative.   Pulmonary f/u can be prn     Total time devoted to counseling  > 50 % of initial 60 min office visit:  review case with pt/ discussion of options/alternatives/ personally creating written customized instructions  in presence of pt  then going over those specific  Instructions directly with the pt including how to use all of the meds but in particular covering each new medication in detail and the difference between the maintenance= "automatic" meds and the prns using an action plan format for the latter (If this problem/symptom => do that organization reading Left to right).  Please see AVS from this visit for a full list of these instructions which I personally wrote for this pt and  are unique to this visit.

## 2018-02-26 DIAGNOSIS — S8001XA Contusion of right knee, initial encounter: Secondary | ICD-10-CM | POA: Insufficient documentation

## 2018-05-02 IMAGING — CR DG CHEST 2V
2 series · 2 of 2 positions shown · non-contrast
Comparison: None.

CLINICAL DATA: Chest pain, initial encounter

EXAM:
CHEST  2 VIEW

[w chest pa]
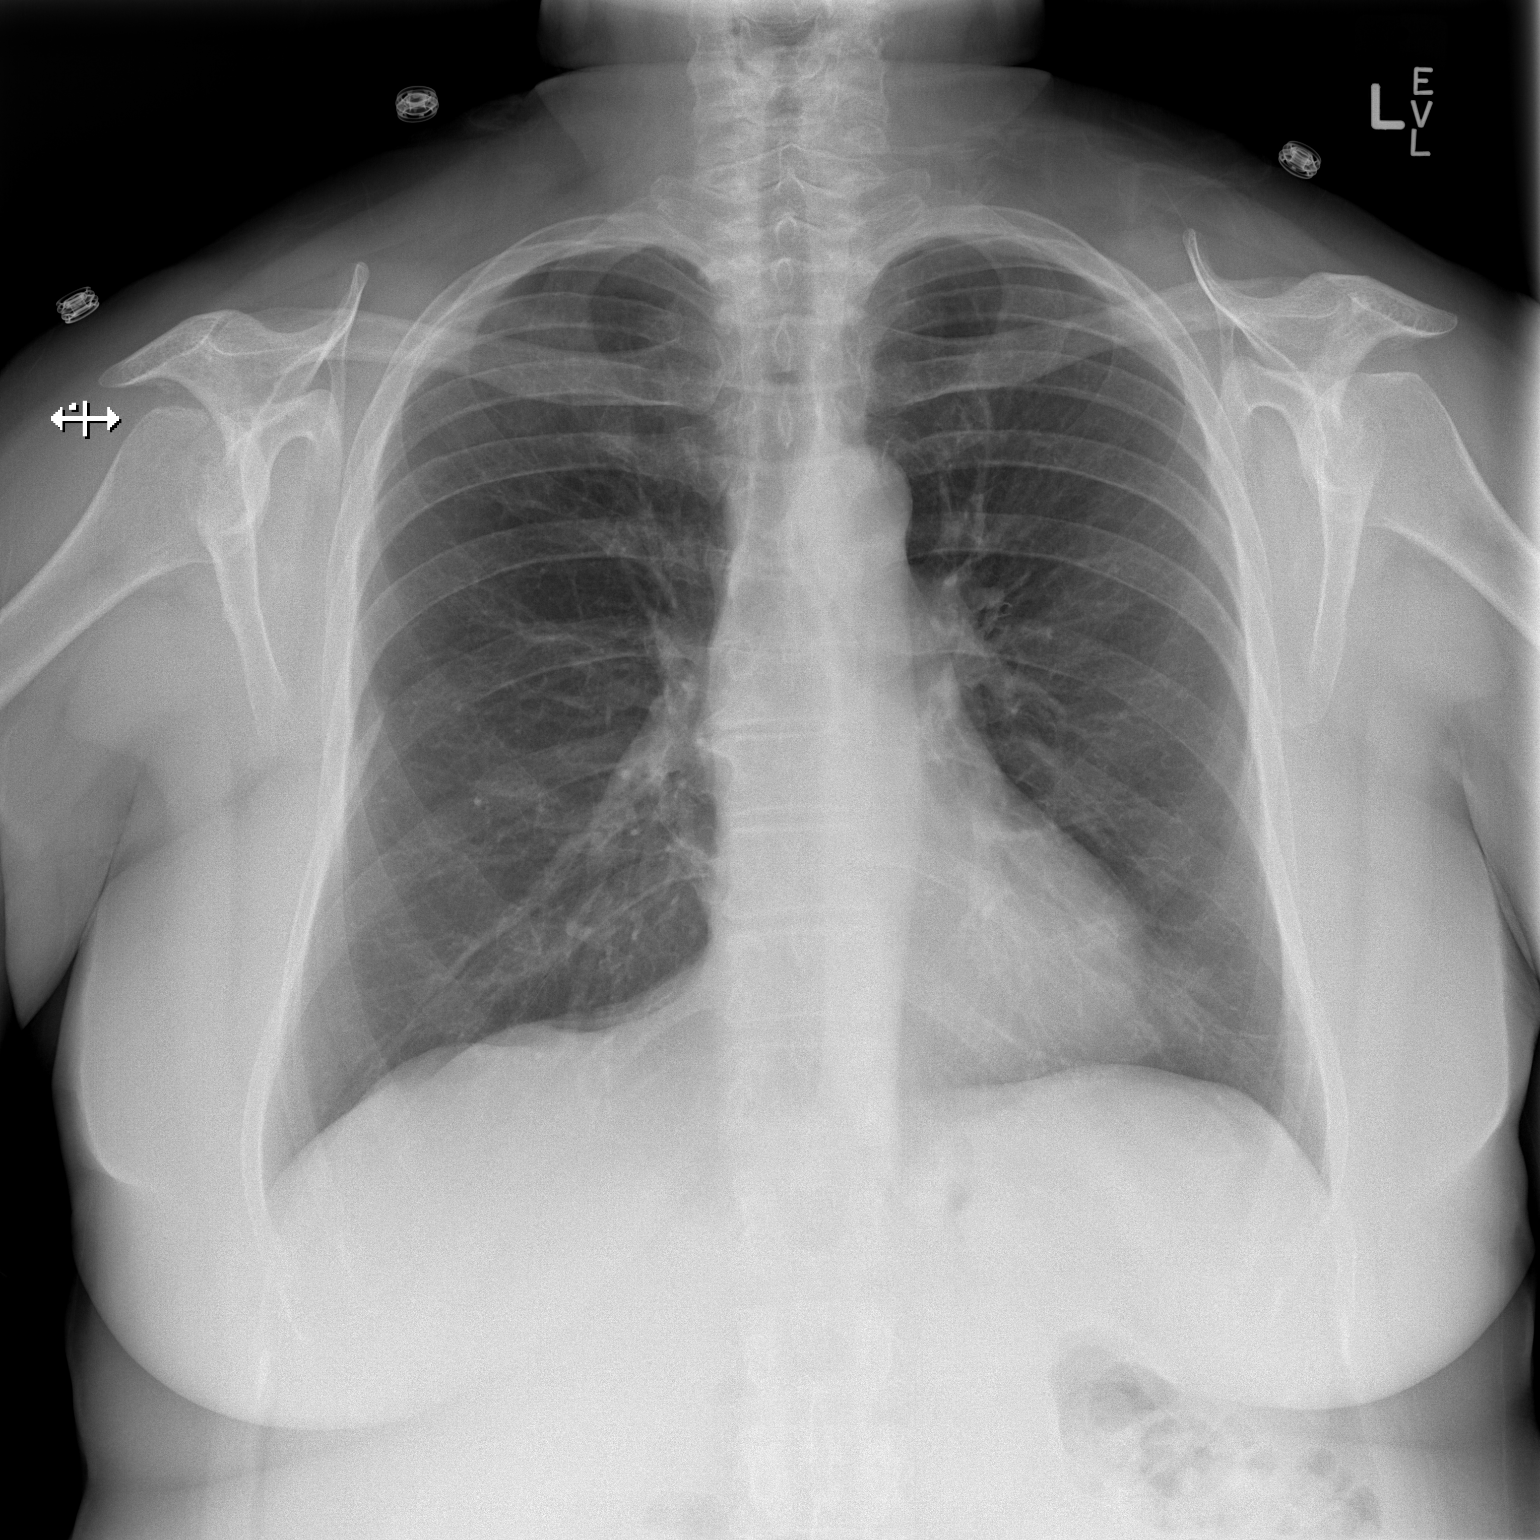

[w chest lat]
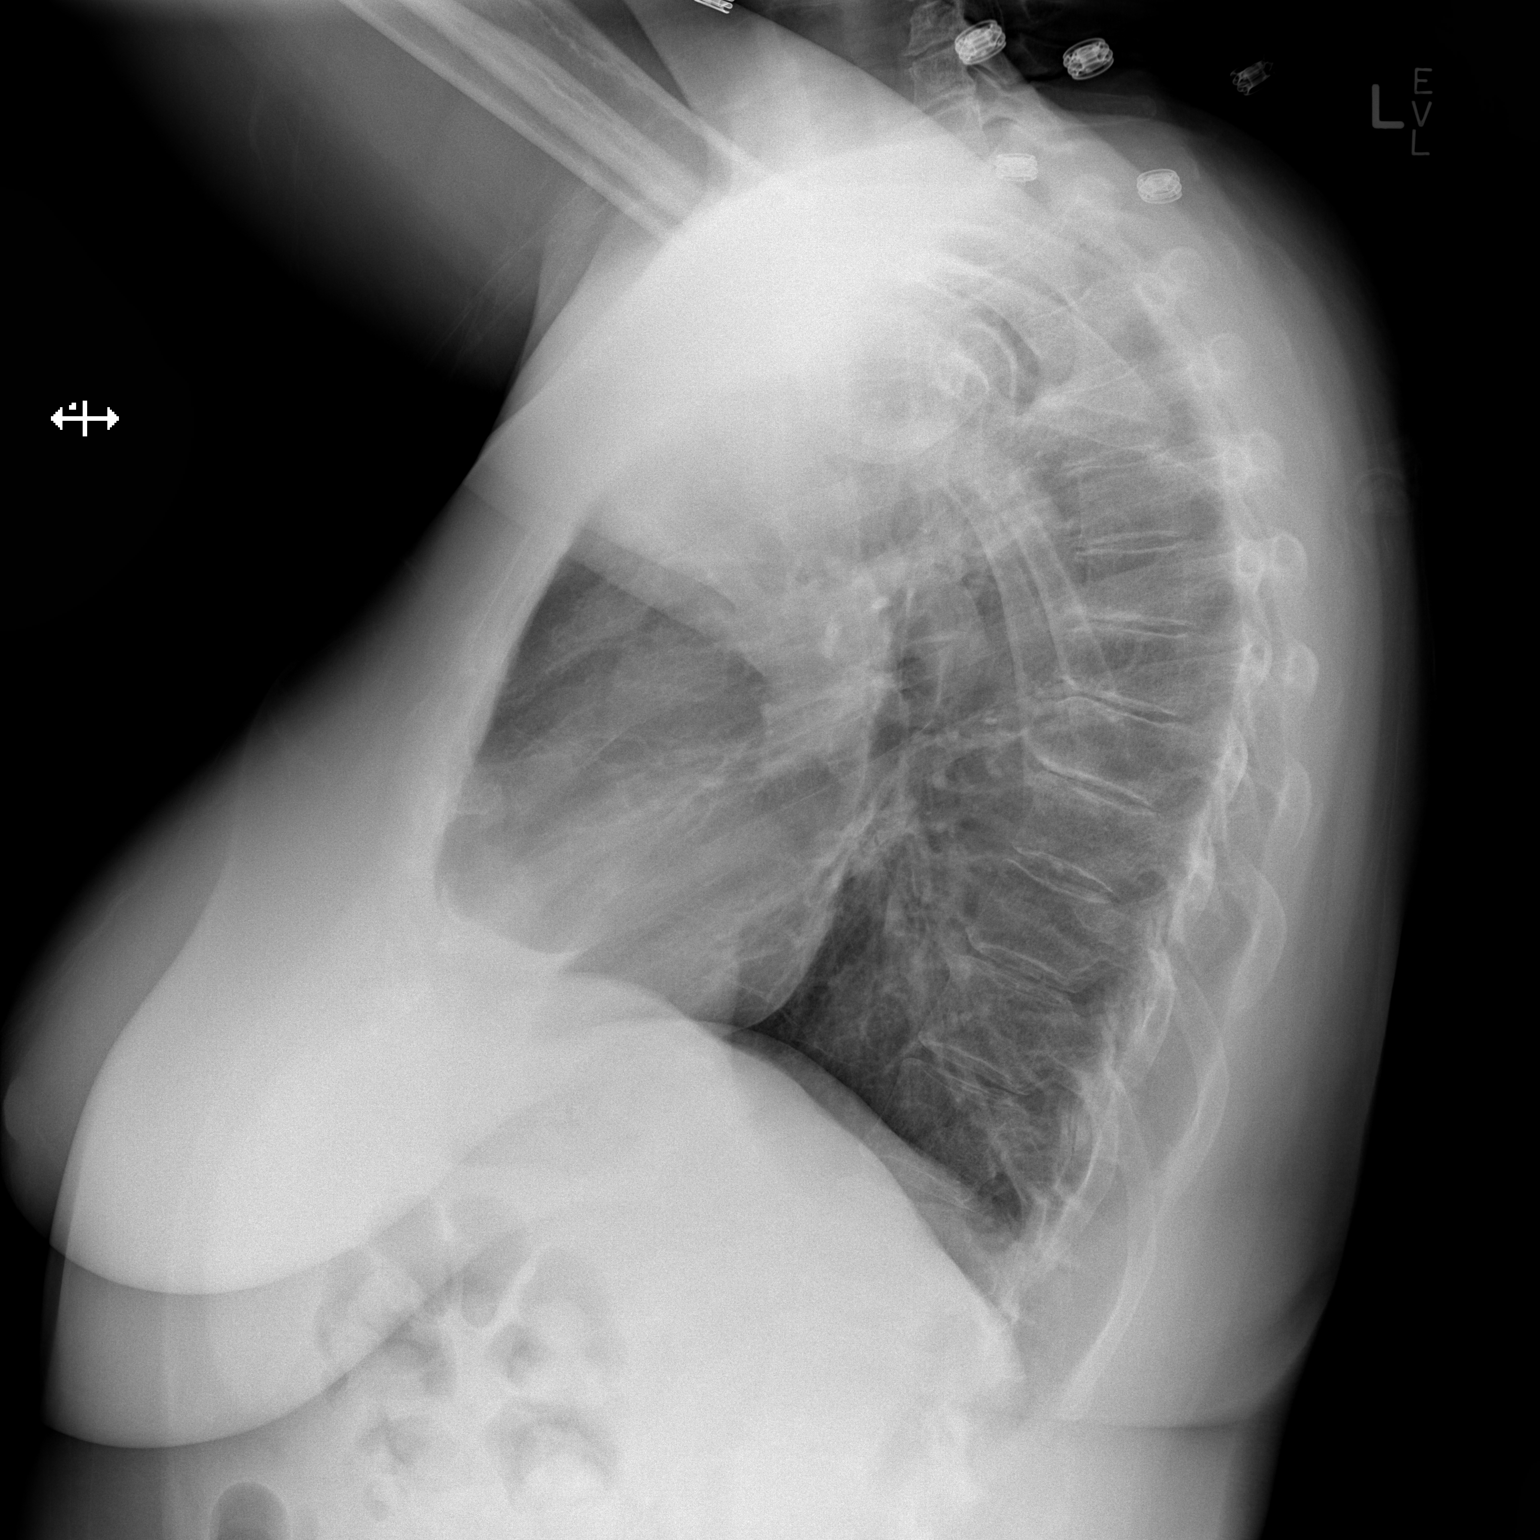

[2 of 2 positions shown; findings below may reference images not displayed]

FINDINGS: The heart size and mediastinal contours are within normal limits.
Both lungs are clear. Degenerative changes along the dorsal spine.
IMPRESSION: No active cardiopulmonary disease.

## 2018-06-25 DIAGNOSIS — R300 Dysuria: Secondary | ICD-10-CM | POA: Insufficient documentation

## 2018-07-06 ENCOUNTER — Other Ambulatory Visit: Payer: Self-pay | Admitting: Obstetrics and Gynecology

## 2018-07-06 DIAGNOSIS — Z1231 Encounter for screening mammogram for malignant neoplasm of breast: Secondary | ICD-10-CM

## 2018-08-03 ENCOUNTER — Ambulatory Visit: Payer: 59

## 2018-10-20 ENCOUNTER — Ambulatory Visit
Admission: RE | Admit: 2018-10-20 | Discharge: 2018-10-20 | Disposition: A | Discharge status: Home / Self Care | Payer: PRIVATE HEALTH INSURANCE | Source: Ambulatory Visit | Attending: Obstetrics and Gynecology | Admitting: Obstetrics and Gynecology

## 2018-10-20 ENCOUNTER — Other Ambulatory Visit: Payer: Self-pay

## 2018-10-20 DIAGNOSIS — Z1231 Encounter for screening mammogram for malignant neoplasm of breast: Secondary | ICD-10-CM

## 2018-11-17 DIAGNOSIS — Z9189 Other specified personal risk factors, not elsewhere classified: Secondary | ICD-10-CM | POA: Insufficient documentation

## 2018-11-17 DIAGNOSIS — R490 Dysphonia: Secondary | ICD-10-CM | POA: Insufficient documentation

## 2018-11-17 IMAGING — CR DG HIP (WITH OR WITHOUT PELVIS) 2-3V*R*
3 series · 3 of 3 positions shown · non-contrast
Comparison: Right femur radiographs - earlier same day

CLINICAL DATA: Recent total knee replacement now with right leg
pain.

EXAM:
DG HIP (WITH OR WITHOUT PELVIS) 2-3V RIGHT

[x pelvis]
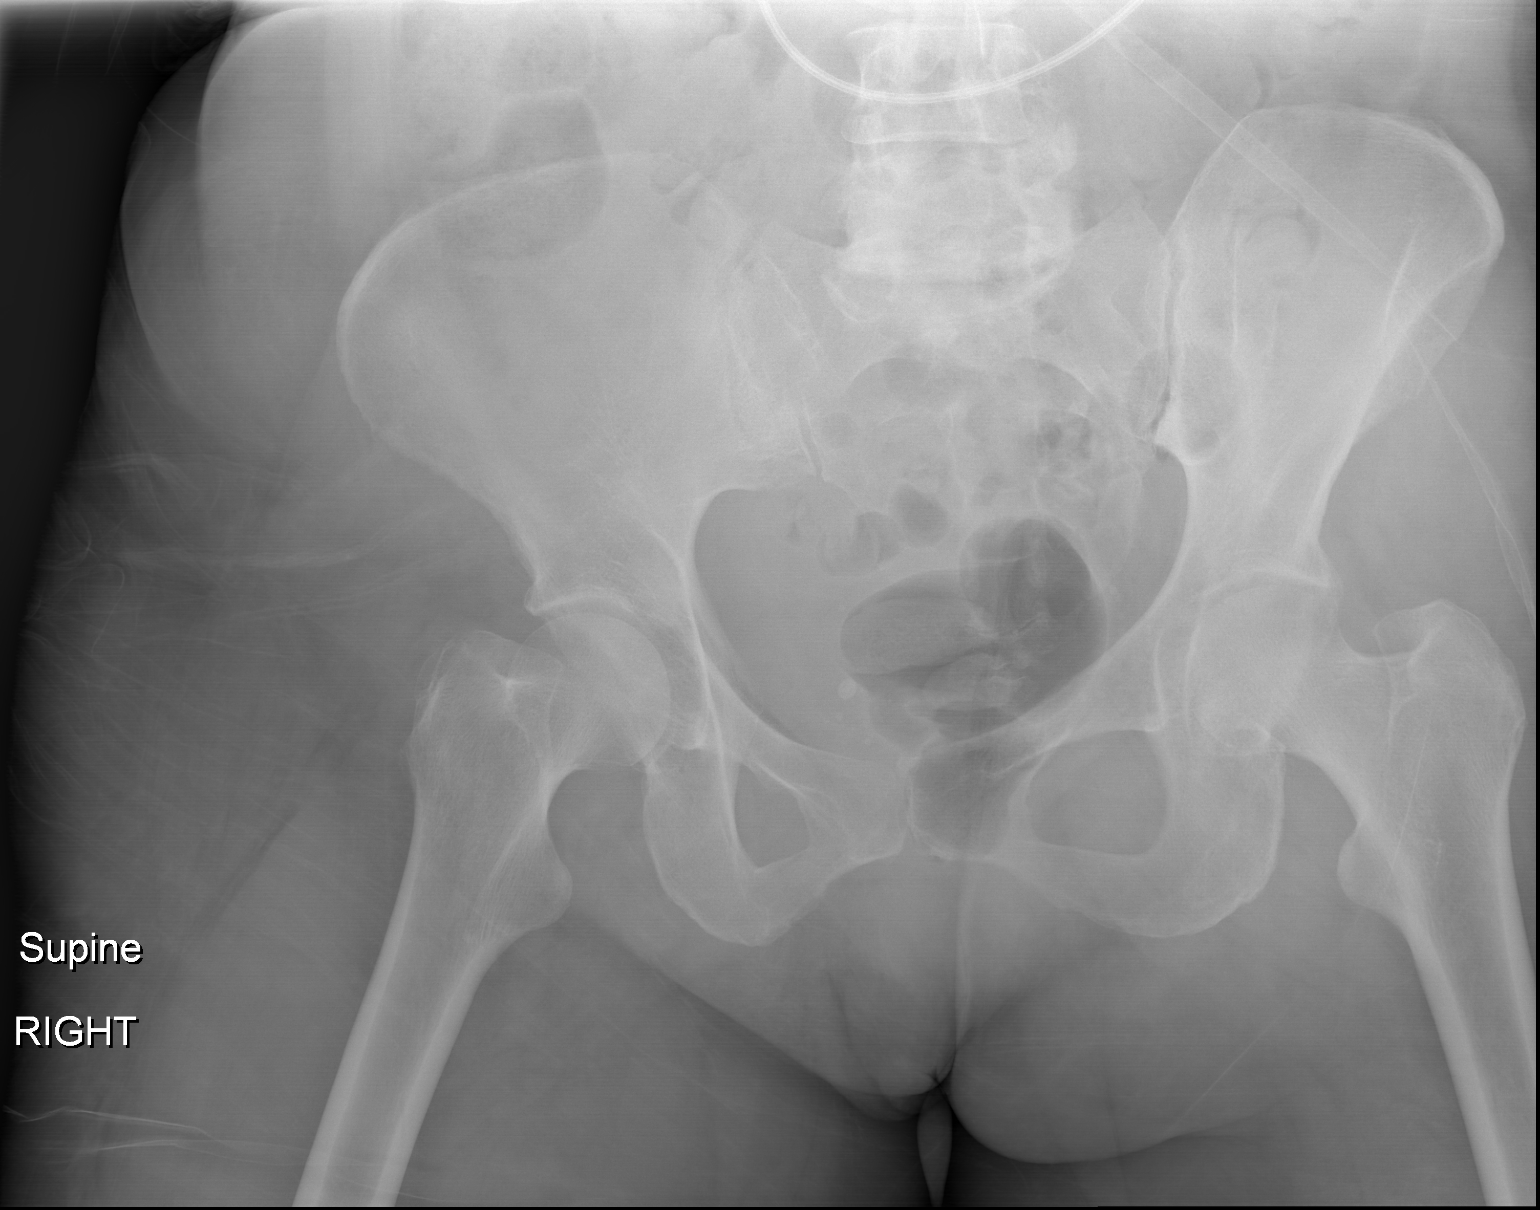

[x hip ap right (1 of 2)]
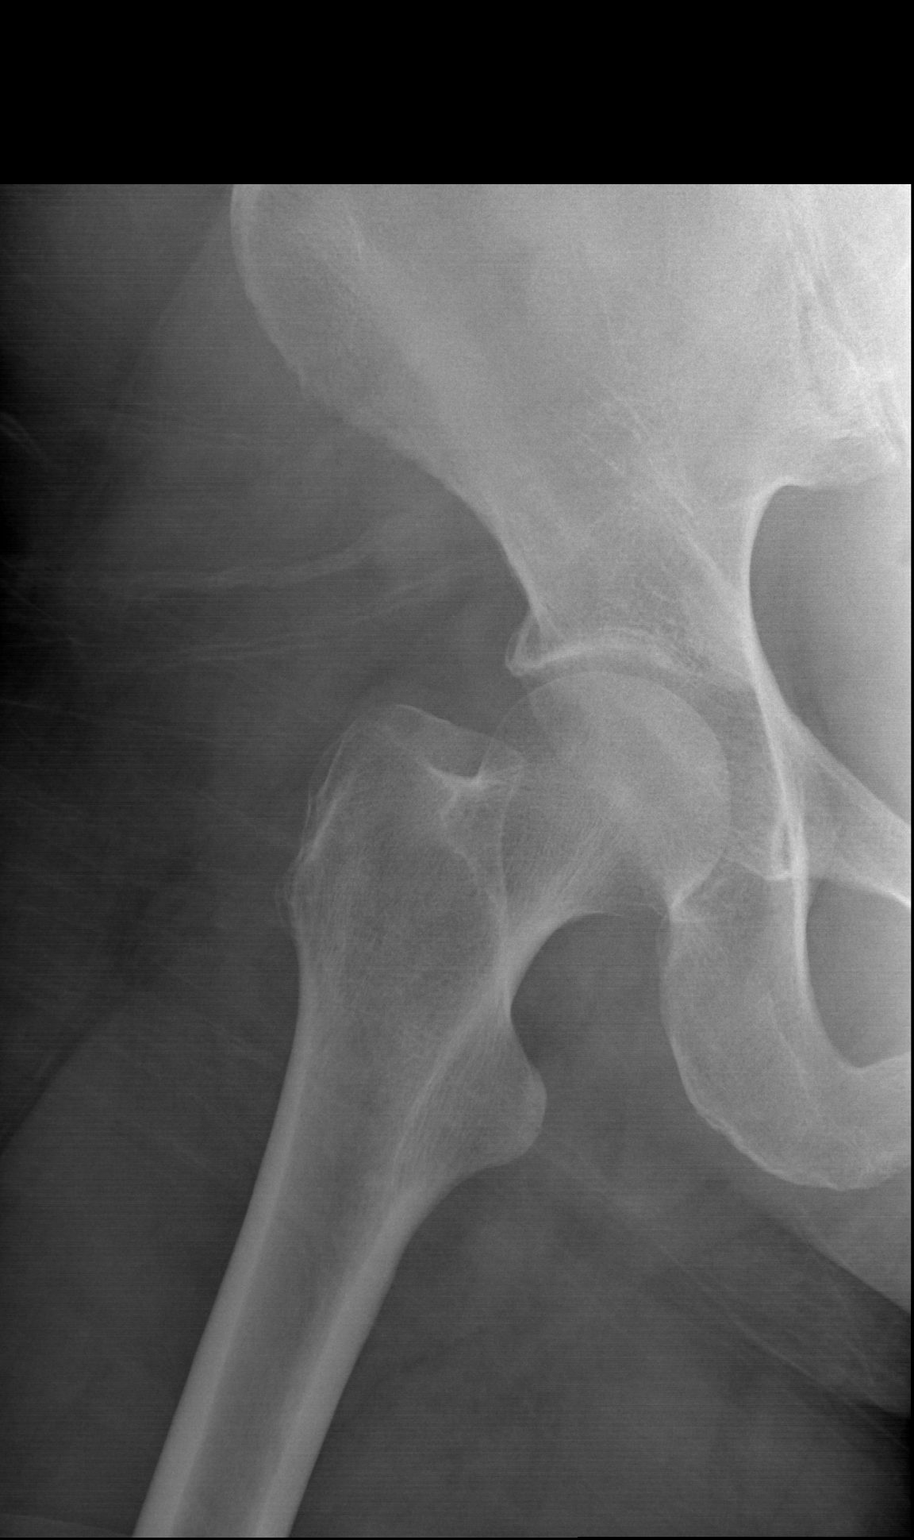

[x hip ap right (2 of 2)]
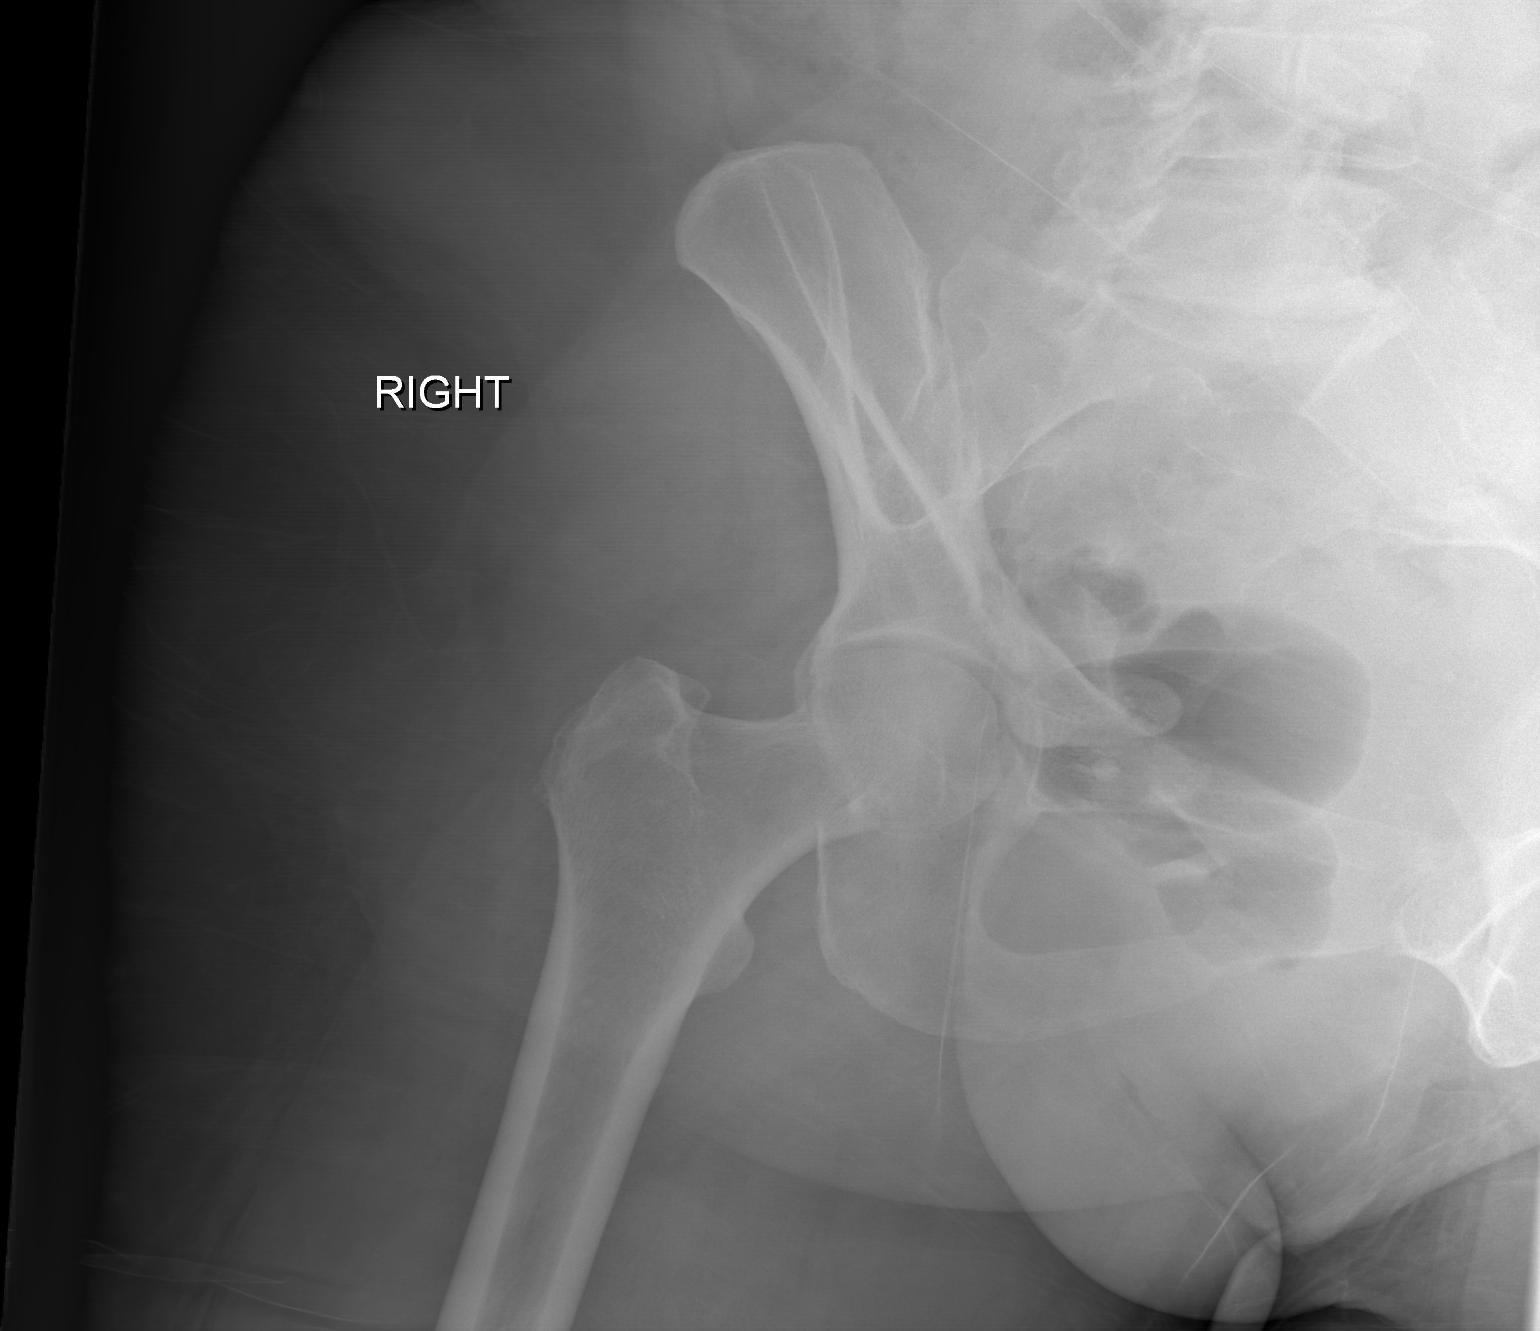

[3 of 3 positions shown; findings below may reference images not displayed]

FINDINGS: No fracture or dislocation. Right hip joint spaces appear preserved.
No evidence avascular necrosis.

Limited visualization the pelvis and contralateral left hip is
normal.

Punctate phleboliths overlies the right hemipelvis. Regional soft
tissues appear otherwise normal.
IMPRESSION: 1. Known midshaft femoral fracture is not imaged on the present
examination. Correlation with dedicated right femur radiographs is
recommended.
2. No proximal femoral or right hip fracture.

## 2018-11-17 IMAGING — RF DG HIP (WITH PELVIS) OPERATIVE*R*
1 series · 6 of 6 positions shown · non-contrast
Comparison: Right hip and right femur 01/23/2017

CLINICAL DATA: Intramedullary nail placement in the right femur.

EXAM:
OPERATIVE RIGHT FEMUR ; DG C-ARM 1-60 MIN-NO REPORT

[Series 1: run · 6 of 6 slices shown]
[im 1/6]
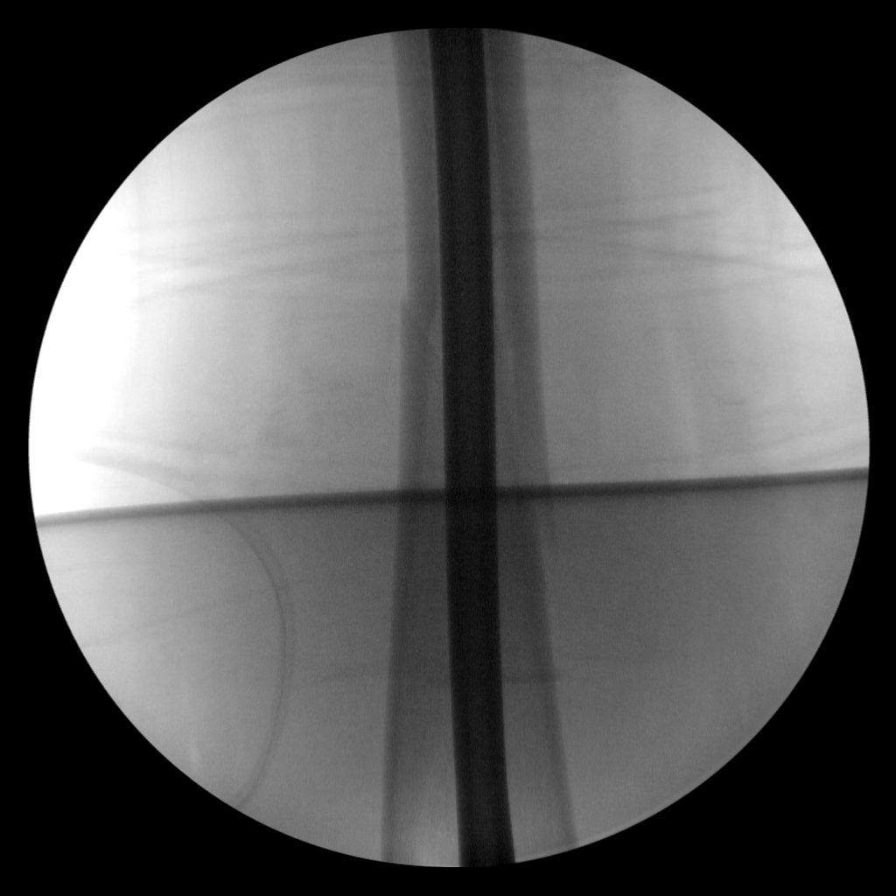
[im 2/6]
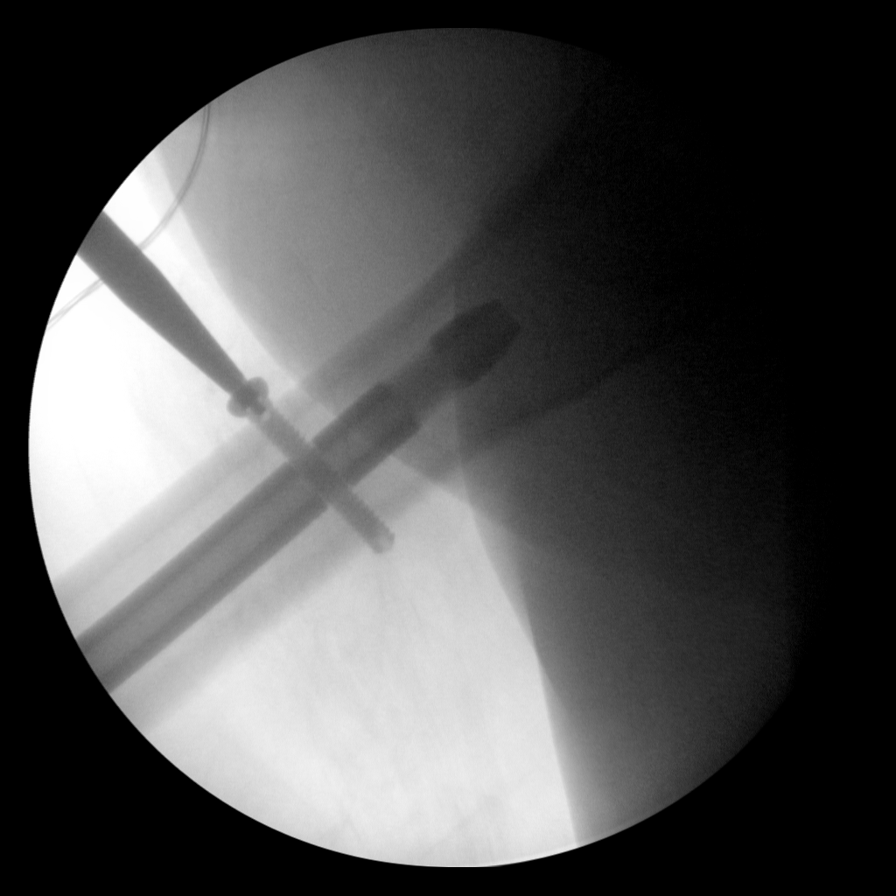
[im 3/6]
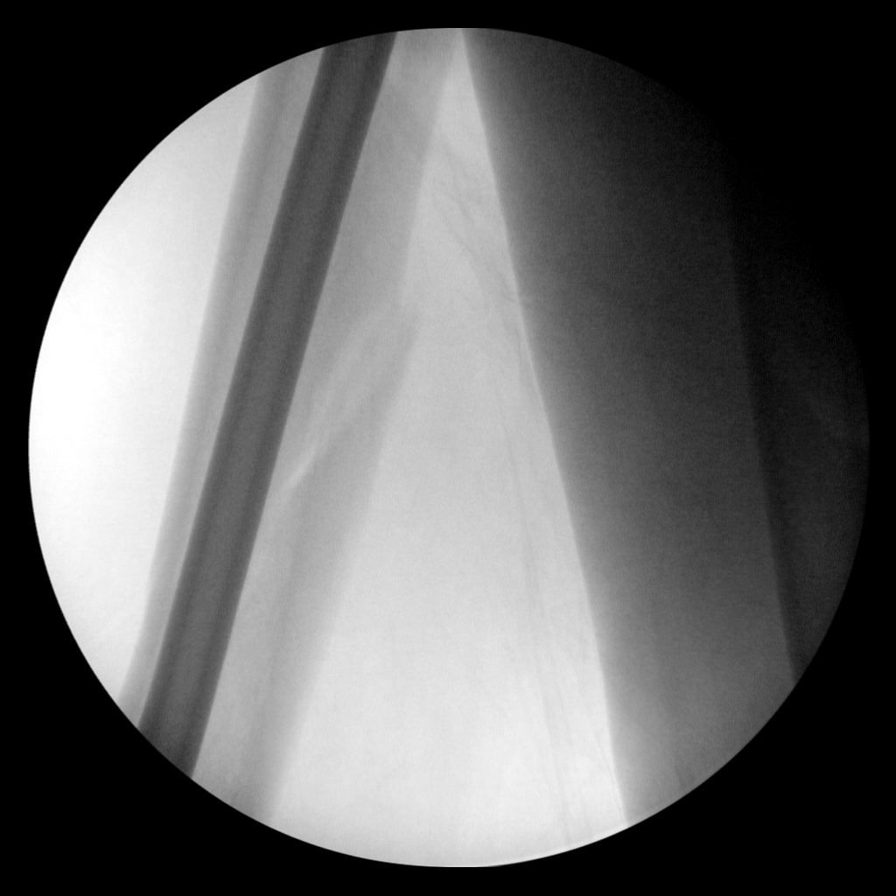
[im 4/6]
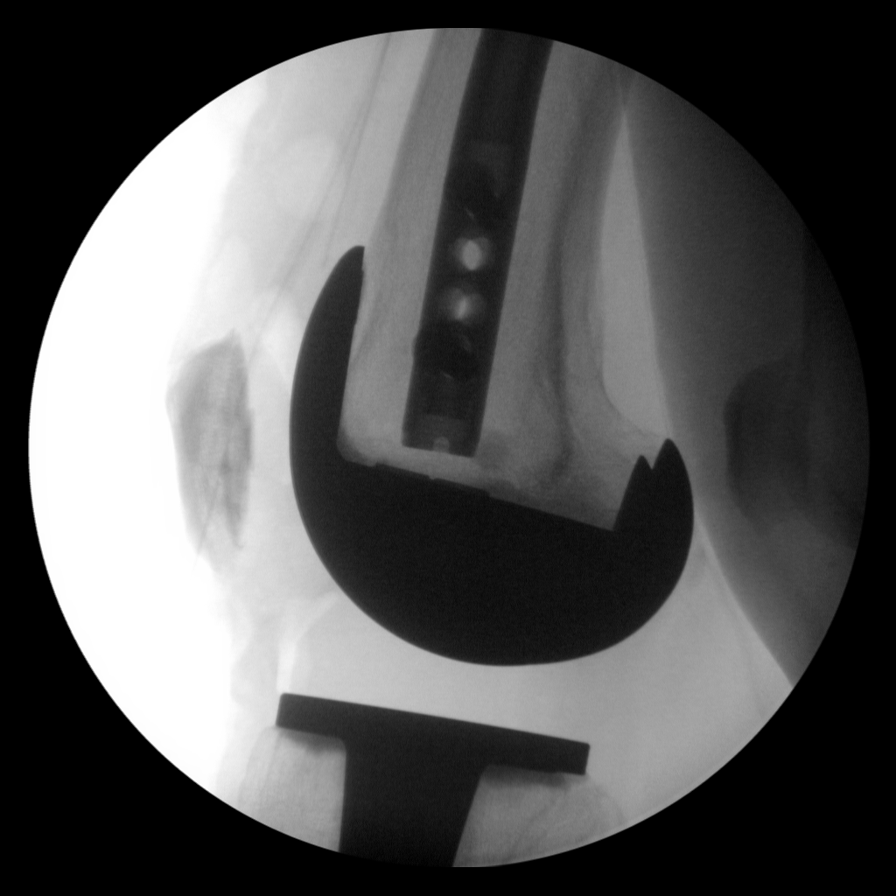
[im 5/6]
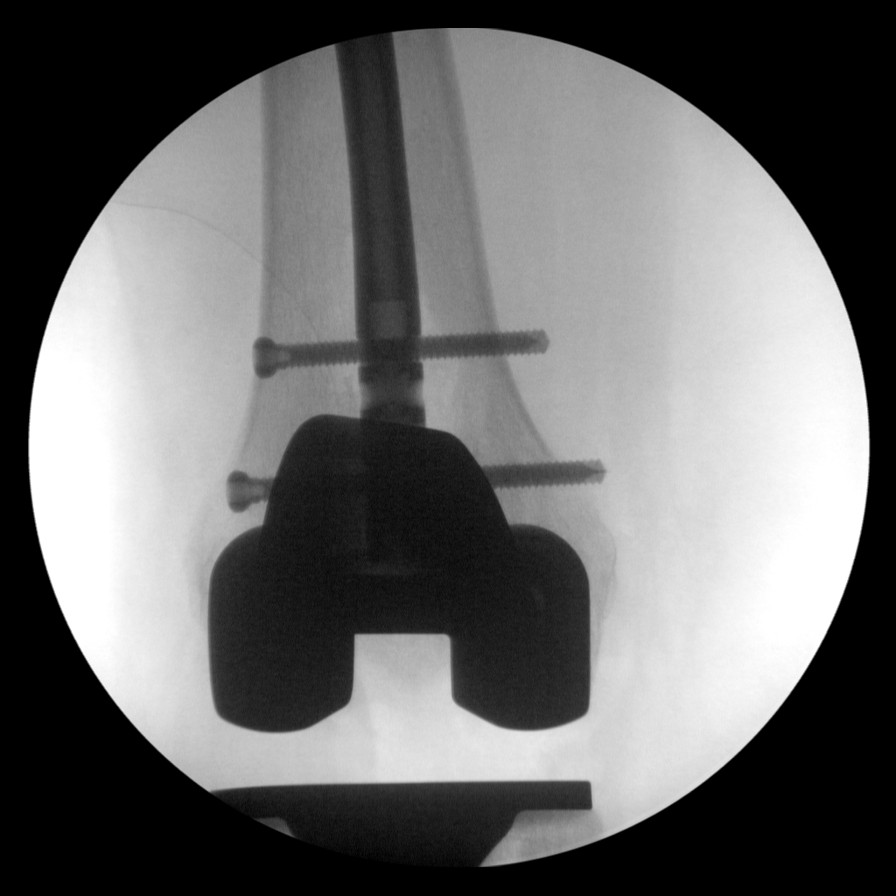
[im 6/6]
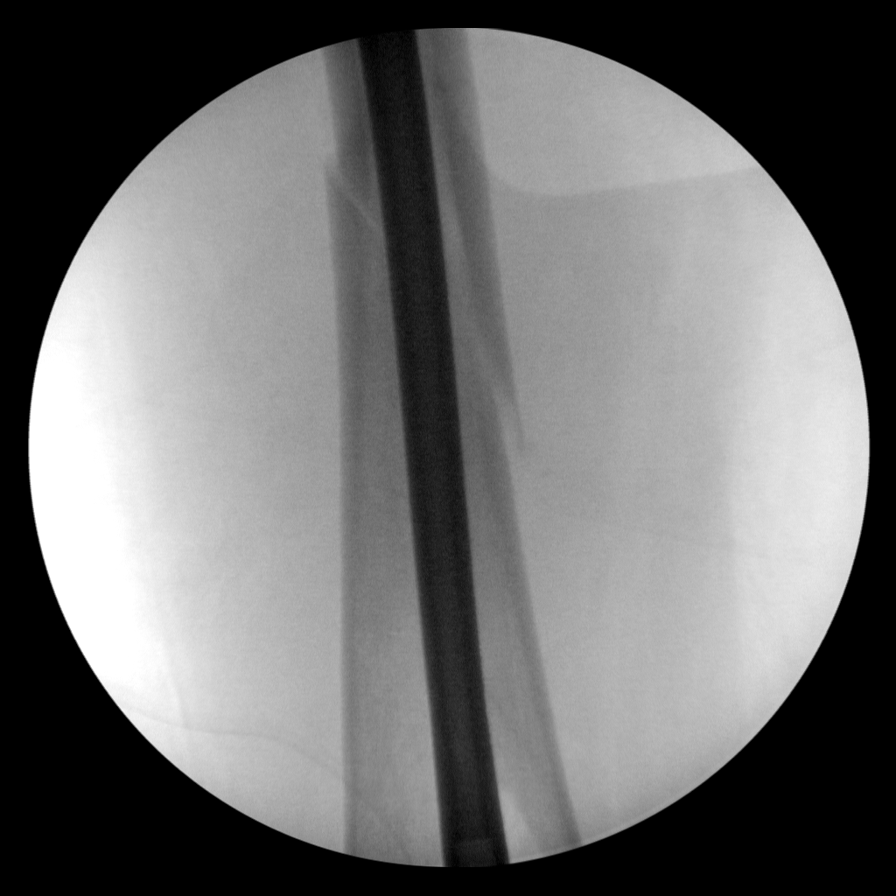

[6 of 6 positions shown; findings below may reference images not displayed]

FLUOROSCOPY TIME:  Fluoroscopy Time:  43 seconds

Radiation Exposure Index (if provided by the fluoroscopic device):
6.73 mGy

Number of Acquired Spot Images: 6
FINDINGS: Intraoperative fluoroscopy is obtained for surgical control
purposes. Spot fluoroscopic images obtained demonstrate placement of
an intramedullary rod across to an oblique fracture of the midshaft
right femur. Near-anatomic alignment and position of the right femur
is demonstrated with the rod in place. A single proximal and 2
distal locking screws are placed. There is a pre-existing right knee
replacement.
IMPRESSION: Intraoperative fluoroscopy obtained for surgical control purposes
demonstrating placement of intramedullary rod fixation of a fracture
of the midshaft right femur.

## 2019-06-02 ENCOUNTER — Ambulatory Visit: Payer: 59

## 2019-06-07 ENCOUNTER — Ambulatory Visit: Payer: 59

## 2019-06-07 DIAGNOSIS — L299 Pruritus, unspecified: Secondary | ICD-10-CM | POA: Insufficient documentation

## 2019-06-11 ENCOUNTER — Ambulatory Visit: Payer: 59 | Attending: Internal Medicine

## 2019-06-11 DIAGNOSIS — Z23 Encounter for immunization: Secondary | ICD-10-CM | POA: Insufficient documentation

## 2019-06-11 NOTE — Progress Notes (Signed)
   Covid-19 Vaccination Clinic  Name:  Mallory Hines    MRN: [id number] DOB: 1950/08/13  06/11/2019  [name] was observed post Covid-19 immunization for 15 minutes without incidence. She was provided with Vaccine Information Sheet and instruction to access the V-Safe system.   [name] was instructed to call 911 with any severe reactions post vaccine: Marland Kitchen Difficulty breathing  . Swelling of your face and throat  . A fast heartbeat  . A bad rash all over your body  . Dizziness and weakness    Immunizations Administered    Name Date Dose VIS Date Route   Pfizer COVID-19 Vaccine 06/11/2019  8:23 AM 0.3 mL 04/15/2019 Intramuscular   Manufacturer: [place]   Lot: [id number]   [place]: [id number]

## 2019-07-06 ENCOUNTER — Ambulatory Visit: Payer: 59 | Attending: Internal Medicine

## 2019-07-06 DIAGNOSIS — Z23 Encounter for immunization: Secondary | ICD-10-CM | POA: Insufficient documentation

## 2019-07-06 NOTE — Progress Notes (Signed)
   Covid-19 Vaccination Clinic  Name:  Mallory Hines    MRN: [id number] DOB: 05/16/1950  07/06/2019  [name] was observed post Covid-19 immunization for 15 minutes without incident. She was provided with Vaccine Information Sheet and instruction to access the V-Safe system.   [name] was instructed to call 911 with any severe reactions post vaccine: Marland Kitchen Difficulty breathing  . Swelling of face and throat  . A fast heartbeat  . A bad rash all over body  . Dizziness and weakness   Immunizations Administered    Name Date Dose VIS Date Route   Pfizer COVID-19 Vaccine 07/06/2019  8:26 AM 0.3 mL 04/15/2019 Intramuscular   Manufacturer: [place]   Lot: [id number]   [place]: [id number]

## 2019-10-21 DIAGNOSIS — H25043 Posterior subcapsular polar age-related cataract, bilateral: Secondary | ICD-10-CM | POA: Insufficient documentation

## 2020-06-14 ENCOUNTER — Other Ambulatory Visit: Payer: Self-pay | Admitting: Internal Medicine

## 2020-06-14 ENCOUNTER — Ambulatory Visit
Admission: RE | Admit: 2020-06-14 | Discharge: 2020-06-14 | Disposition: A | Discharge status: Home / Self Care | Payer: 59 | Source: Ambulatory Visit | Attending: Internal Medicine | Admitting: Internal Medicine

## 2020-06-14 DIAGNOSIS — R109 Unspecified abdominal pain: Secondary | ICD-10-CM

## 2020-06-14 MED ORDER — IOPAMIDOL (ISOVUE-300) INJECTION 61%
100.0000 mL | Freq: Once | INTRAVENOUS | Status: AC | PRN
  Administered 2020-06-14: 100 mL via INTRAVENOUS

## 2020-07-03 ENCOUNTER — Encounter: Payer: Self-pay | Admitting: Physician Assistant

## 2020-07-17 ENCOUNTER — Ambulatory Visit (INDEPENDENT_AMBULATORY_CARE_PROVIDER_SITE_OTHER): Payer: 59 | Admitting: Podiatry

## 2020-07-17 ENCOUNTER — Encounter: Payer: Self-pay | Admitting: Podiatry

## 2020-07-17 ENCOUNTER — Other Ambulatory Visit: Payer: Self-pay

## 2020-07-17 ENCOUNTER — Ambulatory Visit: Payer: Medicare Other

## 2020-07-17 ENCOUNTER — Encounter: Payer: Self-pay | Admitting: Physician Assistant

## 2020-07-17 ENCOUNTER — Ambulatory Visit: Payer: 59 | Admitting: Physician Assistant

## 2020-07-17 VITALS — BP 160/70 | HR 80 | Ht 62.0 in | Wt 171.8 lb

## 2020-07-17 DIAGNOSIS — M7752 Other enthesopathy of left foot: Secondary | ICD-10-CM | POA: Diagnosis not present

## 2020-07-17 DIAGNOSIS — Z1211 Encounter for screening for malignant neoplasm of colon: Secondary | ICD-10-CM

## 2020-07-17 DIAGNOSIS — M2042 Other hammer toe(s) (acquired), left foot: Secondary | ICD-10-CM

## 2020-07-17 DIAGNOSIS — I700 Atherosclerosis of aorta: Secondary | ICD-10-CM | POA: Insufficient documentation

## 2020-07-17 DIAGNOSIS — Z8719 Personal history of other diseases of the digestive system: Secondary | ICD-10-CM

## 2020-07-17 DIAGNOSIS — K5792 Diverticulitis of intestine, part unspecified, without perforation or abscess without bleeding: Secondary | ICD-10-CM | POA: Insufficient documentation

## 2020-07-17 DIAGNOSIS — D2372 Other benign neoplasm of skin of left lower limb, including hip: Secondary | ICD-10-CM

## 2020-07-17 DIAGNOSIS — R7303 Prediabetes: Secondary | ICD-10-CM | POA: Insufficient documentation

## 2020-07-17 MED ORDER — DEXAMETHASONE SODIUM PHOSPHATE 120 MG/30ML IJ SOLN
2.0000 mg | Freq: Once | INTRAMUSCULAR | Status: AC
  Administered 2020-07-17: 2 mg via INTRA_ARTICULAR

## 2020-07-17 MED ORDER — SUTAB 1479-225-188 MG PO TABS: 1.0000 | ORAL_TABLET | ORAL | 0 refills | Status: DC

## 2020-07-17 NOTE — Progress Notes (Signed)
Subjective:  Patient ID: Mallory Hines, female    DOB: [birth year],  MRN: [id number] HPI Chief Complaint  Patient presents with  . Foot Pain    Lateral 5th met left - small, callused area x few months, tender at times, put a medicated disc on it and "picked out a little piece of sand"    70 y.o. female presents with the above complaint.   ROS: Denies fever chills nausea vomiting muscle aches pains calf pain back pain chest pain shortness of breath.  Past Medical History:  Diagnosis Date  . Diverticulitis   . Diverticulosis of colon (without mention of hemorrhage)   . Hypertension   . Malignant melanoma of leg ([place])   . Skin cancer 3149,7026   right calf, face  . Varicose veins    Past Surgical History:  Procedure Laterality Date  . CATARACT EXTRACTION, BILATERAL    . CESAREAN SECTION  1985  . COLONOSCOPY    . FEMUR IM NAIL Right 01/23/2017   Procedure: INTRAMEDULLARY (IM) NAIL FEMORAL;  Surgeon: Gaynelle Arabian, MD;  Location: [facility];  Service: Orthopedics;  Laterality: Right;  . FLEXIBLE SIGMOIDOSCOPY    . FOOT SURGERY Right    3 rd toe  . TOTAL KNEE ARTHROPLASTY Right 01/19/2017   Procedure: RIGHT TOTAL KNEE ARTHROPLASTY;  Surgeon: Gaynelle Arabian, MD;  Location: [facility];  Service: Orthopedics;  Laterality: Right;  with block    Current Outpatient Medications:  .  benazepril (LOTENSIN) 20 MG tablet, TAKE 1 TABLET (20 MG TOTAL) BY MOUTH DAILY. OVERDUE FOR FOLLOW-UP APPT MUST SEE PROVIDER FOR REFILLS (Patient taking differently: Take 20 mg by mouth 2 (two) times daily.), Disp: 30 tablet, Rfl: 0 .  diphenhydrAMINE HCl (BENADRYL ALLERGY PO), Take 1 tablet by mouth as needed (allergic reaction)., Disp: , Rfl:  .  famotidine (PEPCID) 20 MG tablet, Take 20 mg by mouth as needed (allergic reaction)., Disp: , Rfl:  .  hydrochlorothiazide (MICROZIDE) 12.5 MG capsule, Take 1 tablet by mouth daily., Disp: , Rfl:  .  ondansetron (ZOFRAN) 4 MG tablet, Take 1 tablet by mouth 2 (two) times  daily as needed., Disp: , Rfl:  .  Probiotic Product (ALIGN) 4 MG CAPS, Take 1 capsule by mouth in the morning and at bedtime., Disp: , Rfl:  .  Sodium Sulfate-Mag Sulfate-KCl (SUTAB) [phone] MG TABS, Take 1 kit by mouth as directed. MANUFACTURER CODES!! BIN: [id number] PCN: CN GROUP: [id number] MEMBER ID: [id number];[employer] AS SECONDARY INSURANCE ;NO PRIOR AUTHORIZATION, Disp: 24 tablet, Rfl: 0 No current facility-administered medications for this visit.  Facility-Administered Medications Ordered in Other Visits:  .  HYDROmorphone (DILAUDID) injection 0.5-1 mg, 0.5-1 mg, Intravenous, Q2H PRN, Perkins, Alexzandrew L, PA-C .  methocarbamol (ROBAXIN) tablet 500 mg, 500 mg, Oral, Q6H PRN **OR** methocarbamol (ROBAXIN) 500 mg in dextrose 5 % 50 mL IVPB, 500 mg, Intravenous, Q6H PRN, Perkins, Alexzandrew L, PA-C  Allergies  Allergen Reactions  . Amlodipine Other (See Comments)    Leg cramps  . Celecoxib Other (See Comments)    REACTION: whelps  . Codeine Nausea And Vomiting    REACTION: does not like drug  . Meloxicam Other (See Comments)    REACTION: whelps  . Methylprednisolone     Other reaction(s): Other (See Comments)  . Nsaids Other (See Comments)    REACTION: whelps  . Adhesive [Tape] Rash   Review of Systems Objective:  There were no vitals filed for this visit.  General: Well developed, nourished, in no acute distress,  alert and oriented x3   Dermatological: Skin is warm, dry and supple bilateral. Nails x 10 are well maintained; remaining integument appears unremarkable at this time. There are no open sores, no preulcerative lesions, no rash or signs of infection present.  Vascular: Dorsalis Pedis artery and Posterior Tibial artery pedal pulses are 2/4 bilateral with immedate capillary fill time. Pedal hair growth present. No varicosities and no lower extremity edema present bilateral.   Neruologic: Grossly intact via light touch bilateral. Vibratory intact via tuning fork  bilateral. Protective threshold with Semmes Wienstein monofilament intact to all pedal sites bilateral. Patellar and Achilles deep tendon reflexes 2+ bilateral. No Babinski or clonus noted bilateral.   Musculoskeletal: No gross boney pedal deformities bilateral. No pain, crepitus, or limitation noted with foot and ankle range of motion bilateral. Muscular strength 5/5 in all groups tested bilateral.  Gait: Unassisted, Nonantalgic.    Radiographs:  None taken  Assessment & Plan:   Assessment: Bursitis and porokeratosis fifth metatarsal left foot benign skin lesions  Plan: Injected the bursitis today with 2 mg of dexamethasone local anesthetic.  I debrided mechanically all reactive hyperkeratotic tissue.  Debrided benign skin lesions and discussed appropriate shoe gear with her.  She understands this and is amenable to follow-up with me as needed.     Preciosa Bundrick T. Aten, [place]

## 2020-07-17 NOTE — Progress Notes (Signed)
Agree with assessment and plan as outlined.  

## 2020-07-17 NOTE — Patient Instructions (Signed)
If you are age 69 or older, your body mass index should be between 23-30. Your Body mass index is 31.42 kg/m. If this is out of the aforementioned range listed, please consider follow up with your Primary Care Provider.  If you are age 51 or younger, your body mass index should be between 19-25. Your Body mass index is 31.42 kg/m. If this is out of the aformentioned range listed, please consider follow up with your Primary Care Provider.   You have been scheduled for a colonoscopy. Please follow written instructions given to you at your visit today.  Please pick up your prep supplies at the pharmacy within the next 1-3 days. If you use inhalers (even only as needed), please bring them with you on the day of your procedure.  Avoid Popcorn, nuts and seeds.   Use Benefiber and Miralax if needed for Constipation.  Follow up as needed.  Thank you for entrusting me with your care and choosing [facility].  Amy Esterwood, PA-C

## 2020-07-17 NOTE — Progress Notes (Signed)
Subjective:    Patient ID: Mallory Hines, female    DOB: 10-27-1950, 70 y.o.   MRN: [id number]  HPI [name] is a pleasant 70 year old white female, new to GI today referred by Dr. Ardeth Perfect after a recent episode of diverticulitis.  She is known here remotely to Dr. Deatra Ina and was last seen in 2015.  She last had colonoscopy in 2009 per Dr. Deatra Ina that showed a few right-sided diverticuli and multiple diverticuli of the descending and sigmoid colon.  No polyps. Patient has had prior episodes of diverticulitis but says she had not had one for several years.  When she was seen here in 2015 she was treated for diverticulitis. She had an episode in mid February 2022 with onset of left-sided abdominal pain which was persistent.  She was seen at her PCPs office and started on a course of Cipro and Flagyl.  She says she took it for about a week and her symptoms really had not improved at all.  She called back and was given a course of Augmentin which she completed and had good response to.  Her symptoms resolved by the time she completed the Augmentin.  During that episode she denies having any changes in bowel habits, no melena or hematochezia, no nausea or vomiting and no fever chills or dysuria. She says she feels pretty much back to normal at this point and is having good normal bowel movements.  She rarely ever has any issues with constipation. She had been eating popcorn fairly regularly over the couple of weeks prior to onset of the diverticulitis. She did have CT of the abdomen pelvis done on 06/14/2020 because of the persistence of symptoms despite a week of Cipro and Flagyl.  This showed focal diverticulitis at the junction of the descending and sigmoid colon, no abscess. Patient has history of hypertension, osteoarthritis, prior melanoma of the lower extremity, prior cystitis.  Review of Systems Pertinent positive and negative review of systems were noted in the above HPI section.  All other review of  systems was otherwise negative.  Outpatient Encounter Medications as of 07/17/2020  Medication Sig  . benazepril (LOTENSIN) 20 MG tablet TAKE 1 TABLET (20 MG TOTAL) BY MOUTH DAILY. OVERDUE FOR FOLLOW-UP APPT MUST SEE PROVIDER FOR REFILLS (Patient taking differently: Take 20 mg by mouth 2 (two) times daily.)  . diphenhydrAMINE HCl (BENADRYL ALLERGY PO) Take 1 tablet by mouth as needed (allergic reaction).  . famotidine (PEPCID) 20 MG tablet Take 20 mg by mouth as needed (allergic reaction).  . hydrochlorothiazide (MICROZIDE) 12.5 MG capsule Take 1 tablet by mouth daily.  . ondansetron (ZOFRAN) 4 MG tablet Take 1 tablet by mouth 2 (two) times daily as needed.  . Probiotic Product (ALIGN) 4 MG CAPS Take 1 capsule by mouth in the morning and at bedtime.  . Sodium Sulfate-Mag Sulfate-KCl (SUTAB) [phone] MG TABS Take 1 kit by mouth as directed. MANUFACTURER CODES!! BIN: [id number] PCN: CN GROUP: [id number] MEMBER ID: [id number];[employer] AS SECONDARY INSURANCE ;NO PRIOR AUTHORIZATION  . [DISCONTINUED] benzonatate (TESSALON) 100 MG capsule Take 1 capsule by mouth 3 (three) times daily as needed.  . [DISCONTINUED] predniSONE (STERAPRED UNI-PAK 21 TAB) 10 MG (21) TBPK tablet As directed x 6 days   Facility-Administered Encounter Medications as of 07/17/2020  Medication  . HYDROmorphone (DILAUDID) injection 0.5-1 mg  . methocarbamol (ROBAXIN) tablet 500 mg   Or  . methocarbamol (ROBAXIN) 500 mg in dextrose 5 % 50 mL IVPB   Allergies  Allergen Reactions  .  Amlodipine Other (See Comments)    Leg cramps  . Celecoxib Other (See Comments)    REACTION: whelps  . Codeine Nausea And Vomiting    REACTION: does not like drug  . Meloxicam Other (See Comments)    REACTION: whelps  . Methylprednisolone     Other reaction(s): Other (See Comments)  . Nsaids Other (See Comments)    REACTION: whelps  . Adhesive [Tape] Rash   Patient Active Problem List   Diagnosis Date Noted  . Diverticulitis 07/17/2020  .  Hardening of the aorta (main artery of the heart) ([place]) 07/17/2020  . Prediabetes 07/17/2020  . Posterior subcapsular polar age-related cataract of both eyes 10/21/2019  . Generalized pruritus 06/07/2019  . Dysphonia 11/17/2018  . Risk of exposure to communicable disease 11/17/2018  . Dysuria 06/25/2018  . Contusion of right knee 02/26/2018  . Upper airway cough syndrome 02/10/2018  . Cough 01/05/2018  . Pneumonia 01/05/2018  . Low back pain 10/20/2017  . Displaced comminuted fracture of shaft of right femur, initial encounter for closed fracture ([place]) 01/23/2017  . Femur fracture, right ([place]) 01/23/2017  . OA (osteoarthritis) of knee 01/19/2017  . Abnormal glucose level 01/07/2017  . Dermatographic urticaria 01/07/2017  . Hyperlipidemia 01/07/2017  . Spider veins of both lower extremities 03/20/2015  . Varicose veins of leg with complications 00/93/8182  . Varicose veins of lower extremities with complications 99/37/1696  . Encounter for general adult medical examination without abnormal findings 07/26/2014  . Hemorrhoids 07/26/2014  . History of infectious disease 07/26/2014  . Malignant neoplasm of skin of face 07/26/2014  . Sinusitis, acute 07/10/2014  . LLQ abdominal pain 07/12/2013  . Diverticulitis of colon (without mention of hemorrhage)(562.11) 07/12/2013  . ABDOMINAL PAIN, LEFT LOWER QUADRANT 01/16/2010  . Cystitis 07/18/2009  . VITAMIN D DEFICIENCY 02/17/2008  . Essential hypertension 08/25/2007  . MELANOMA, LEG, HX OF 08/25/2007  . Unspecified personal history presenting hazards to health 08/25/2007   Social History   Socioeconomic History  . Marital status: Married    Spouse name: Not on file  . Number of children: 2  . Years of education: Not on file  . Highest education level: Not on file  Occupational History  . Occupation: housewife  Tobacco Use  . Smoking status: Never Smoker  . Smokeless tobacco: Never Used  Vaping Use  . Vaping Use: Never used   Substance and Sexual Activity  . Alcohol use: Yes    Alcohol/week: 3.0 standard drinks    Types: 3 Glasses of wine per week    Comment: weekly  . Drug use: No  . Sexual activity: Yes    Birth control/protection: Post-menopausal  Other Topics Concern  . Not on file  Social History Narrative  . Not on file   Social Determinants of Health   Financial Resource Strain: Not on file  Food Insecurity: Not on file  Transportation Needs: Not on file  Physical Activity: Not on file  Stress: Not on file  Social Connections: Not on file  Intimate Partner Violence: Not on file    Ms. Lazalde's family history includes Diabetes in her sister; Heart attack (age of onset: 83) in her father; Kidney disease in her sister; Pancreatic cancer in her mother.      Objective:    Vitals:   07/17/20 1130  BP: (!) 160/70  Pulse: 80    Physical Exam Well-developed well-nourished older female in no acute distress.  Very pleasant height, Weight, BMI 31.4  HEENT; nontraumatic  normocephalic, EOMI, PE R LA, sclera anicteric. Oropharynx; not examined Neck; supple, no JVD Cardiovascular; regular rate and rhythm with S1-S2, no murmur rub or gallop Pulmonary; Clear bilaterally Abdomen; soft, nontender, nondistended, no palpable mass or hepatosplenomegaly, bowel sounds are active Rectal; not done today Skin; benign exam, no jaundice rash or appreciable lesions Extremities; no clubbing cyanosis or edema skin warm and dry Neuro/Psych; alert and oriented x4, grossly nonfocal mood and affect appropriate       Assessment & Plan:   #24 70 year old female with history of recurrent diverticulitis with recent episode of persistent diverticulitis February 2022 initially treated with Cipro and Flagyl without much response after 1 week and switch to Augmentin with resolution.  #2 colon cancer screening-last colonoscopy 2009 no polyps #3 diverticulosis 4.  History of hypertension 5.  Osteoarthritis 6.  Prior  melanoma lower extremity  Plan; We discussed general management of diverticulosis with high-fiber diet, plenty of fluids/water  and general avoidance of popcorn and nuts. She does not generally have any issues with constipation, advised adding Benefiber or MiraLAX as needed for any episodes of constipation. Patient will be scheduled for Colonoscopy with Dr. Havery Moros.  Her husband is a patient of Dr. Doyne Keel and she is requesting Dr. Havery Moros.  Procedure was discussed in detail with the patient including indications risks and benefits and she is agreeable to proceed.  Shaneya Taketa S Sobia Karger PA-C 07/17/2020   Cc: Velna Hatchet, MD

## 2020-09-10 ENCOUNTER — Encounter: Payer: 59 | Admitting: Gastroenterology

## 2020-11-11 ENCOUNTER — Encounter: Payer: Self-pay | Admitting: Certified Registered Nurse Anesthetist

## 2020-11-12 ENCOUNTER — Ambulatory Visit (AMBULATORY_SURGERY_CENTER): Payer: 59 | Admitting: Gastroenterology

## 2020-11-12 ENCOUNTER — Other Ambulatory Visit: Payer: Self-pay

## 2020-11-12 ENCOUNTER — Encounter: Payer: Self-pay | Admitting: Gastroenterology

## 2020-11-12 VITALS — BP 146/58 | HR 58 | Temp 97.2°F | Resp 42 | Ht 62.0 in | Wt 171.0 lb

## 2020-11-12 DIAGNOSIS — K6389 Other specified diseases of intestine: Secondary | ICD-10-CM | POA: Diagnosis not present

## 2020-11-12 DIAGNOSIS — D122 Benign neoplasm of ascending colon: Secondary | ICD-10-CM

## 2020-11-12 DIAGNOSIS — Z1211 Encounter for screening for malignant neoplasm of colon: Secondary | ICD-10-CM

## 2020-11-12 DIAGNOSIS — Z8719 Personal history of other diseases of the digestive system: Secondary | ICD-10-CM

## 2020-11-12 DIAGNOSIS — D123 Benign neoplasm of transverse colon: Secondary | ICD-10-CM

## 2020-11-12 DIAGNOSIS — D125 Benign neoplasm of sigmoid colon: Secondary | ICD-10-CM

## 2020-11-12 DIAGNOSIS — D120 Benign neoplasm of cecum: Secondary | ICD-10-CM

## 2020-11-12 MED ORDER — SODIUM CHLORIDE 0.9 % IV SOLN: 500.0000 mL | Freq: Once | INTRAVENOUS | Status: DC

## 2020-11-12 NOTE — Progress Notes (Signed)
Report given to PACU, vss 

## 2020-11-12 NOTE — Patient Instructions (Addendum)
Handouts were given to your care partner on polyps, diverticulosis, and  hemorrhoids. You may resume your current medications today. Await biopsy results.  May take 1-3 weeks to receive pathology results. Please call if any questions or concerns.     YOU HAD AN ENDOSCOPIC PROCEDURE TODAY AT [facility]:   Refer to the procedure report that was given to you for any specific questions about what was found during the examination.  If the procedure report does not answer your questions, please call your gastroenterologist to clarify.  If you requested that your care partner not be given the details of your procedure findings, then the procedure report has been included in a sealed envelope for you to review at your convenience later.  YOU SHOULD EXPECT: Some feelings of bloating in the abdomen. Passage of more gas than usual.  Walking can help get rid of the air that was put into your GI tract during the procedure and reduce the bloating. If you had a lower endoscopy (such as a colonoscopy or flexible sigmoidoscopy) you may notice spotting of blood in your stool or on the toilet paper. If you underwent a bowel prep for your procedure, you may not have a normal bowel movement for a few days.  Please Note:  You might notice some irritation and congestion in your nose or some drainage.  This is from the oxygen used during your procedure.  There is no need for concern and it should clear up in a day or so.  SYMPTOMS TO REPORT IMMEDIATELY:  Following lower endoscopy (colonoscopy or flexible sigmoidoscopy):  Excessive amounts of blood in the stool  Significant tenderness or worsening of abdominal pains  Swelling of the abdomen that is new, acute  Fever of 100F or higher     For urgent or emergent issues, a gastroenterologist can be reached at any hour by calling [phone]. Do not use MyChart messaging for urgent concerns.    DIET:  We do recommend a small meal at first, but  then you may proceed to your regular diet.  Drink plenty of fluids but you should avoid alcoholic beverages for 24 hours.  ACTIVITY:  You should plan to take it easy for the rest of today and you should NOT DRIVE or use heavy machinery until tomorrow (because of the sedation medicines used during the test).    FOLLOW UP: Our staff will call the number listed on your records 48-72 hours following your procedure to check on you and address any questions or concerns that you may have regarding the information given to you following your procedure. If we do not reach you, we will leave a message.  We will attempt to reach you two times.  During this call, we will ask if you have developed any symptoms of COVID 19. If you develop any symptoms (ie: fever, flu-like symptoms, shortness of breath, cough etc.) before then, please call [phone].  If you test positive for Covid 19 in the 2 weeks post procedure, please call and report this information to [place].    If any biopsies were taken you will be contacted by phone or by letter within the next 1-3 weeks.  Please call us at [phone] if you have not heard about the biopsies in 3 weeks.    SIGNATURES/CONFIDENTIALITY: You and/or your care partner have signed paperwork which will be entered into your electronic medical record.  These signatures attest to the fact that that the information above on  your After Visit Summary has been reviewed and is understood.  Full responsibility of the confidentiality of this discharge information lies with you and/or your care-partner.

## 2020-11-12 NOTE — Op Note (Signed)
[place] Patient Name: Tiannah Greenly Procedure Date: 11/12/2020 7:54 AM MRN: [id number] Endoscopist: Remo Lipps P. Havery Moros , MD Age: 70 Referring MD:  Date of Birth: 1950/05/09 Gender: Female Account #: [id number] Procedure:                Colonoscopy Indications:              Screening for colorectal malignant neoplasm,                            history of diverticulitis on a few occasions in the                            past, most recently left sided in February Medicines:                Monitored Anesthesia Care Procedure:                Pre-Anesthesia Assessment:                           - Prior to the procedure, a History and Physical                            was performed, and patient medications and                            allergies were reviewed. The patient's tolerance of                            previous anesthesia was also reviewed. The risks                            and benefits of the procedure and the sedation                            options and risks were discussed with the patient.                            All questions were answered, and informed consent                            was obtained. Prior Anticoagulants: The patient has                            taken no previous anticoagulant or antiplatelet                            agents. ASA Grade Assessment: II - A patient with                            mild systemic disease. After reviewing the risks                            and benefits, the patient was deemed in  satisfactory condition to undergo the procedure.                           After obtaining informed consent, the colonoscope                            was passed under direct vision. Throughout the                            procedure, the patient's blood pressure, pulse, and                            oxygen saturations were monitored continuously. The                            Olympus  PCF-H190DL ([id number]) Colonoscope was                            introduced through the anus and advanced to the the                            cecum, identified by appendiceal orifice and                            ileocecal valve. The colonoscopy was performed                            without difficulty. The patient tolerated the                            procedure well. The quality of the bowel                            preparation was adequate. The ileocecal valve,                            appendiceal orifice, and rectum were photographed. Scope In: 8:14:14 AM Scope Out: 8:39:33 AM Scope Withdrawal Time: 0 hours 20 minutes 12 seconds  Total Procedure Duration: 0 hours 25 minutes 19 seconds  Findings:                 Hemorrhoids were found on perianal exam.                           A 3 mm polyp was found in the cecum. The polyp was                            sessile. The polyp was removed with a cold snare.                            Resection and retrieval were complete.                           A 8 to 10 mm polyp was found in the  ascending                            colon. The polyp was sessile. The polyp was removed                            with a cold snare. Resection and retrieval were                            complete.                           Two sessile polyps were found in the transverse                            colon. The polyps were 3 mm in size. These polyps                            were removed with a cold snare. Resection and                            retrieval were complete.                           A diverticulum was found in the sigmoid colon,                            about 35cm from anal verge, with ? polypoid lesion                            but unable to see clear margin, invaded                            diverticulum. The lesion was flat and I suspect                            more likely benign fold / variant from                             diverticulosis but biopsies were taken with a cold                            forceps for histology to ensure no adenomatous                            change. Area just distal to this was was tattooed                            with an injection of Spot (carbon black) in case                            any pre-cancerous changes.  Many medium-mouthed diverticula were found in the                            entire colon, highest burden in left colon and                            hepatic flexure.                           Internal hemorrhoids were found during retroflexion.                           The exam was otherwise without abnormality. Complications:            No immediate complications. Estimated blood loss:                            Minimal. Estimated Blood Loss:     Estimated blood loss was minimal. Impression:               - Hemorrhoids found on perianal exam.                           - One 3 mm polyp in the cecum, removed with a cold                            snare. Resected and retrieved.                           - One 8 to 10 mm polyp in the ascending colon,                            removed with a cold snare. Resected and retrieved.                           - Two 3 mm polyps in the transverse colon, removed                            with a cold snare. Resected and retrieved.                           - Diverticulum in the sigmoid colon with suspected                            benign normal variant change but biopsied to ensure                            no adenomatous change. Tattooed.                           - Diverticulosis in the entire examined colon.                           - Internal hemorrhoids.                           -  The examination was otherwise normal. Recommendation:           - Patient has a contact number available for                            emergencies. The signs and symptoms of potential                             delayed complications were discussed with the                            patient. Return to normal activities tomorrow.                            Written discharge instructions were provided to the                            patient.                           - Resume previous diet.                           - Continue present medications.                           - Await pathology results. Remo Lipps P. Connelly [place], MD 11/12/2020 8:46:54 AM This report has been signed electronically.

## 2020-11-12 NOTE — Progress Notes (Signed)
No problems noted in the recovery room. maw 

## 2020-11-12 NOTE — Progress Notes (Signed)
Called to room to assist during endoscopic procedure.  Patient ID and intended procedure confirmed with present staff. Received instructions for my participation in the procedure from the performing physician.  

## 2020-11-12 NOTE — Progress Notes (Signed)
VS by CW. ?

## 2020-11-14 ENCOUNTER — Telehealth: Payer: Self-pay

## 2020-11-14 NOTE — Telephone Encounter (Signed)
Left voice message.

## 2021-01-16 ENCOUNTER — Telehealth: Payer: Self-pay | Admitting: Gastroenterology

## 2021-01-16 NOTE — Telephone Encounter (Signed)
Spoke with patient, she states that she has noticed some tenderness in the center of her left side. Patient states that she did bring some luggage down on Friday, this weighed about 35-40 lbs. She states that the tenderness comes and goes. She states that her stools are great and she has no other symptoms. She states that she is usually a homebody but they have been traveling more than usual. Advised that it is possible that she has pulled a muscle due to the heavy lifting. Pt has not tried a heating pad or Tylenol. Pt states that she will keep an eye on it, she is supposed to travel again tomorrow. Advised patient to call us back if her symptoms worsen or if she develops any new symptoms. Pt verbalized understanding and had no concerns at the end of the call.

## 2021-01-16 NOTE — Telephone Encounter (Signed)
Patient called requested to speak with a nurse regarding a tightness she has been feeling something like gas she is not sure.

## 2021-01-22 ENCOUNTER — Telehealth: Payer: Self-pay | Admitting: Gastroenterology

## 2021-01-22 NOTE — Telephone Encounter (Signed)
Attempted to reach patient twice. Lm on vm for patient to return call.

## 2021-01-22 NOTE — Telephone Encounter (Signed)
Pt returned call. Pt reports that she has LLQ pain when she lays down, she has had this discomfort for about 1 week now. She reports that it feels like pressure. I asked patient if it felt like diverticulitis when she had it before, she states before she had pain with movement but not this time. Pt denies any bleeding, diarrhea, or fever. Pt reports that she feels full on the left side. No extra gas/bloating. Pt states that she had a good bowel movement this morning. Pt is willing to proceed with imaging or medications as directed. Please advise, thanks.

## 2021-01-22 NOTE — Telephone Encounter (Signed)
Inbound call from pt requesting a call back stating that she is having pain on her left side. She stated that she thought it might have came from her traveling with lugguage but her pain has gotten worse over time. Please advise. Thank you.

## 2021-01-22 NOTE — Telephone Encounter (Signed)
Hard to say what is causing this just based on description. Could be diverticulitis, musculoskeletal also possible. If she thinks this is c/w prior diverticulitis symptoms which she had earlier this year, could give cipro 500mg  BID and flagyl 500mg  TID for 5-7 days. If she is not sure may need clinic visit to evaluate her. I am not in the office this week, not sure if APPs or her PCP has any openings. If she wants empiric antibiotics to cover possibility of diverticulitis can do that. Thanks

## 2021-01-22 NOTE — Telephone Encounter (Signed)
Spoke with patient in regards to recommendations. Mallory Savoy, NP had a cancellation. Pt has been scheduled to see Mallory Savoy, NP on Wednesday, 01/23/21 at 2 PM. Tia Alert, CMA is aware that patient has been added. Pt had no concerns at the end of the call.

## 2021-01-23 ENCOUNTER — Ambulatory Visit: Payer: 59 | Admitting: Nurse Practitioner

## 2021-01-23 ENCOUNTER — Encounter: Payer: Self-pay | Admitting: Nurse Practitioner

## 2021-01-23 VITALS — BP 138/80 | HR 76 | Ht 62.0 in | Wt 174.2 lb

## 2021-01-23 DIAGNOSIS — R1032 Left lower quadrant pain: Secondary | ICD-10-CM

## 2021-01-23 MED ORDER — AMOXICILLIN-POT CLAVULANATE 875-125 MG PO TABS: 1.0000 | ORAL_TABLET | Freq: Two times a day (BID) | ORAL | 0 refills | Status: AC

## 2021-01-23 NOTE — Progress Notes (Signed)
ASSESSMENT AND PLAN    # 70 yo female with a three week history LLQ and documented history of diverticulitis. While pain is not really reminiscent of diverticulitis it is reasonable to treat with a course of antibiotics and she if she improves.  If no imiprovement with a course of antibiotics, consider treating empirically for musculoskeletal pain unless pain progresses in interim at which point she we may need CT scan.  --Augmentin has worked for her in the past. Will give Augmentin 875 mg BID x 7 days.  --Low fiber diet over next few days  --Call us Monday with an update. Call sooner if symptoms worsening  HISTORY OF PRESENT ILLNESS    Chief Complaint : Lower abdominal pain  Mallory Hines is a 70 y.o. female known previously to Dr. Deatra Ina, now Dr. Havery Moros since March 2022. She had a past medical history significant for diverticulitis, hypertension, osteoarthritis, prior melanoma of the lower extremity.  See PMH below for any additional medical problems.    March 2022 office visit for recent diverticulitis .  Resolution of symptoms with Augmentin.  Scheduled for screening colonoscopy as her last one was back in 2000 Four polyps ( TAs) removed ranging in size from 3-10 mm . Patient has a history of diverticulitis documented by CT scan. Three weeks ago she developed a dull, achy pain in her LLQ . The pain is constant.  Hurts to lay on the left side.  Pain not relieved with bowel movements .  Her bowel movements are perfect . She says it doesn't feel like when she had diverticulititis in the past. No fevers.  No urinary or vaginal symptoms. She took a road trip yesterday and it was uncomfortable to sit for several hours.  Pain gets worse if she engages in certain activities involving the LLQ She does a lot of yard work , walks several miles and also recently picked up a heavy suit case but doesn't feel the pain is musculoskeetal.    Feb 2022 CTAP W/ contrast for adominal pain   IMPRESSION: 1. Focal diverticulitis is seen at the junction of the descending and sigmoid colon. No abscess formation is noted. 2. Aortic atherosclerosis.   PREVIOUS ENDOSCOPIC EVALUATIONS / PERTINENT STUDIES:   July 2022 screening colonoscopy and also history of diverticulitis --Hemorrhoids found on perianal exam. - One 3 mm polyp in the cecum, removed with a cold snare. Resected and retrieved. - One 8 to 10 mm polyp in the ascending colon, removed with a cold snare. Resected and retrieved. - Two 3 mm polyps in the transverse colon, removed with a cold snare. Resected and retrieved. - Diverticulum in the sigmoid colon with suspected benign normal variant change but biopsied to ensure no adenomatous change. Tattooed. - Diverticulosis in the entire examined colon. - Internal hemorrhoids. - The examination was otherwise normal. Surgical [P], colon, ascending and cecum, polyp (2)  - TUBULAR ADENOMA(S). - NO HIGH GRADE DYSPLASIA OR CARCINOMA. 2. Surgical [P], colon, transverse, polyp (2) - TUBULAR ADENOMA (1). - NO HIGH GRADE DYSPLASIA OR CARCINOMA. - COLONIC MUCOSA WITH BENIGN LYMPHOID AGGREGATE (1). 3. Surgical [P], colon, sigmoid - COLONIC MUCOSA WITH BENIGN LYMPHOID AGGREGATES. - NO ADENOMATOUS CHANGE OR CARCINOMA.     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   Current Outpatient Medications  Medication Sig Dispense Refill   benazepril (LOTENSIN) 20 MG tablet TAKE 1 TABLET (20 MG TOTAL) BY MOUTH DAILY. OVERDUE FOR FOLLOW-UP APPT MUST SEE PROVIDER FOR REFILLS (Patient taking differently: Take 20 mg by mouth 2  (two) times daily. Taking 2 tablets in the morning and 1 at night tablet) 30 tablet 0   hydrochlorothiazide (MICROZIDE) 12.5 MG capsule Take 1 tablet by mouth daily.     loratadine (CLARITIN) 10 MG tablet Take 10 mg by mouth as needed for allergies.     diphenhydrAMINE HCl (BENADRYL ALLERGY PO) Take 1 tablet by mouth as needed (allergic reaction). (Patient not taking: Reported on 01/23/2021)     No current facility-administered medications for this visit.   Facility-Administered Medications Ordered in Other Visits  Medication Dose Route Frequency Provider Last Rate Last Admin   HYDROmorphone (DILAUDID) injection 0.5-1 mg  0.5-1 mg Intravenous Q2H PRN Perkins, Alexzandrew L, PA-C       methocarbamol (ROBAXIN) tablet 500 mg  500 mg Oral Q6H PRN Perkins, Alexzandrew L, PA-C       Or   methocarbamol (ROBAXIN) 500 mg in dextrose 5 % 50 mL IVPB  500 mg Intravenous Q6H PRN Perkins, Alexzandrew L, PA-C        Review of Systems: No chest pain. No shortness of breath. No urinary complaints.   PHYSICAL EXAM :    Wt Readings from Last 3 Encounters:  01/23/21 174 lb 4 oz (79 kg)  11/12/20 171 lb (77.6 kg)  07/17/20 171 lb 12.8 oz (77.9 kg)    BP 138/80 (BP Location: Left Arm, Patient Position: Sitting, Cuff Size: Normal)   Pulse 76   Ht 5\' 2"  (1.575 m) Comment: height measured without shoes  Wt 174 lb 4 oz (79 kg)   LMP 05/05/2001 (Approximate)   BMI 31.87 kg/m  Constitutional:  Pleasant female in no acute distress. Psychiatric: Normal mood and affect. Behavior is normal. EENT: Pupils normal.  Conjunctivae are normal. No scleral icterus. Neck supple.  Cardiovascular: Normal rate, regular rhythm. No edema Pulmonary/chest: Effort normal and breath sounds normal. No wheezing, rales or rhonchi. Abdominal: Soft, nondistended, very minimal tenderness in LLQ. Bowel sounds active throughout. There are no masses palpable. No hepatomegaly. Neurological: Alert and oriented to person place and  time. Skin: Skin is warm and dry. No rashes noted.  Tye Savoy, NP  01/23/2021, 2:20 PM

## 2021-01-23 NOTE — Patient Instructions (Addendum)
If you are age 69 or older, your body mass index should be between 23-30. Your Body mass index is 31.87 kg/m. If this is out of the aforementioned range listed, please consider follow up with your Primary Care Provider.  If you are age 11 or younger, your body mass index should be between 19-25. Your Body mass index is 31.87 kg/m. If this is out of the aformentioned range listed, please consider follow up with your Primary Care Provider.   __________________________________________________________  The [facility] providers would like to encourage you to use [facility] to communicate with providers for non-urgent requests or questions.  Due to long hold times on the telephone, sending your provider a message by [facility] may be a faster and more efficient way to get a response.  Please allow 48 business hours for a response.  Please remember that this is for non-urgent requests.   Continue a low fiber diet for a few days.  We have sent the following medications to your pharmacy for you to pick up at your convenience: Augmentin 875-125 mg: Take twice daily for 7 days.  Please call Monday and ask for Beth to give [place] an update, or sooner if getting worse.   Thank you for entrusting me with your care and for choosing [facility], Tye Savoy, NP-C

## 2021-01-23 NOTE — Progress Notes (Signed)
Agree with assessment and plan as outlined. Mallory Hines thanks for seeing her.

## 2021-07-25 ENCOUNTER — Ambulatory Visit: Payer: Medicare Other | Admitting: Podiatry

## 2021-08-01 ENCOUNTER — Encounter: Payer: Self-pay | Admitting: Podiatry

## 2021-08-01 ENCOUNTER — Ambulatory Visit (INDEPENDENT_AMBULATORY_CARE_PROVIDER_SITE_OTHER): Payer: 59 | Admitting: Podiatry

## 2021-08-01 DIAGNOSIS — M778 Other enthesopathies, not elsewhere classified: Secondary | ICD-10-CM | POA: Diagnosis not present

## 2021-08-01 MED ORDER — TRIAMCINOLONE ACETONIDE 40 MG/ML IJ SUSP
20.0000 mg | Freq: Once | INTRAMUSCULAR | Status: AC
  Administered 2021-08-01: 20 mg

## 2021-08-01 NOTE — Progress Notes (Signed)
She presents today chief complaint of pain to the dorsal aspect of the left foot states that she has been to orthopedics and they said that there was nothing wrong with her gave her a anti-inflammatory steroid pack and it really did not help at all. ? ?Objective: Vital signs are stable she is alert and oriented x3 pulses are palpable.  She has a firm nonpulsatile mass to the dorsal aspect of her left foot on the palpation easily detectable spurs at the base of the second metatarsal cuneiform joint. ? 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 ? ?Plan: Discussed etiology pathology conservative versus surgical therapies.  Discussed appropriate shoe gear stretching exercise ice therapy and sugar modifications.  We also injected the dorsal aspect of the foot with 20 mg Kenalog 5 mg Marcaine across the dorsum making sure not to inject into any tendon she tolerated procedure well without complications.  Follow-up with her as needed ?

## 2023-01-22 ENCOUNTER — Telehealth: Payer: Self-pay | Admitting: Nurse Practitioner

## 2023-01-22 NOTE — Telephone Encounter (Signed)
Inbound call from patient, states she has been having some change in her diets and believes is having a flare up. Patient states that she is having sharp pains in her side and would like to discuss symptoms with a nurse. Please advise.

## 2023-01-22 NOTE — Telephone Encounter (Signed)
Called patient in reference to complaints of lower left sided abdominal pain. Patient has a hx of diverticulitis, denies fever, and blood in the stool. Patient was last seen 01/23/21. Patient states she has been having pain for 2 weeks and the pain has been constant. Suggested patient go to the ED or the Urgent care due to the constant pain. Appt made with Mallory Cluster, NP for 04/13/23 at 9:00a. Also discussed diet, informing patient to continue bland diet and avoid diary products, greasy and spicy foods. Patient understood and agreed.

## 2023-04-13 ENCOUNTER — Ambulatory Visit: Payer: 59 | Admitting: Nurse Practitioner

## 2024-06-09 ENCOUNTER — Other Ambulatory Visit: Payer: Self-pay | Admitting: Internal Medicine

## 2024-06-09 ENCOUNTER — Encounter: Payer: Self-pay | Admitting: Gastroenterology

## 2024-06-09 DIAGNOSIS — Z1231 Encounter for screening mammogram for malignant neoplasm of breast: Secondary | ICD-10-CM

## 2024-06-10 ENCOUNTER — Inpatient Hospital Stay: Admission: RE | Admit: 2024-06-10

## 2024-06-10 DIAGNOSIS — Z1231 Encounter for screening mammogram for malignant neoplasm of breast: Secondary | ICD-10-CM

## 2024-06-21 ENCOUNTER — Encounter

## 2024-07-05 ENCOUNTER — Encounter: Admitting: Gastroenterology
